# Patient Record
Sex: Female | Born: 1945 | Race: White | Hispanic: No | Smoking: Never smoker
Health system: Southern US, Community
[De-identification: ages and names within clinical notes are randomized; demographics above are authoritative.]

## PROBLEM LIST (undated history)

## (undated) DIAGNOSIS — Z8489 Family history of other specified conditions: Secondary | ICD-10-CM

## (undated) DIAGNOSIS — E039 Hypothyroidism, unspecified: Secondary | ICD-10-CM

## (undated) DIAGNOSIS — E538 Deficiency of other specified B group vitamins: Secondary | ICD-10-CM

## (undated) DIAGNOSIS — F410 Panic disorder [episodic paroxysmal anxiety] without agoraphobia: Secondary | ICD-10-CM

## (undated) DIAGNOSIS — R319 Hematuria, unspecified: Secondary | ICD-10-CM

## (undated) DIAGNOSIS — K219 Gastro-esophageal reflux disease without esophagitis: Secondary | ICD-10-CM

## (undated) DIAGNOSIS — I10 Essential (primary) hypertension: Secondary | ICD-10-CM

## (undated) DIAGNOSIS — K56699 Other intestinal obstruction unspecified as to partial versus complete obstruction: Secondary | ICD-10-CM

## (undated) DIAGNOSIS — E7800 Pure hypercholesterolemia, unspecified: Secondary | ICD-10-CM

## (undated) DIAGNOSIS — F419 Anxiety disorder, unspecified: Secondary | ICD-10-CM

## (undated) DIAGNOSIS — E785 Hyperlipidemia, unspecified: Secondary | ICD-10-CM

## (undated) DIAGNOSIS — K5792 Diverticulitis of intestine, part unspecified, without perforation or abscess without bleeding: Secondary | ICD-10-CM

## (undated) DIAGNOSIS — K760 Fatty (change of) liver, not elsewhere classified: Secondary | ICD-10-CM

## (undated) HISTORY — DX: Essential (primary) hypertension: I10

## (undated) HISTORY — DX: Deficiency of other specified B group vitamins: E53.8

## (undated) HISTORY — PX: BREAST BIOPSY: SHX20

## (undated) HISTORY — DX: Diverticulitis of intestine, part unspecified, without perforation or abscess without bleeding: K57.92

## (undated) HISTORY — DX: Hematuria, unspecified: R31.9

## (undated) HISTORY — DX: Panic disorder (episodic paroxysmal anxiety): F41.0

## (undated) HISTORY — DX: Fatty (change of) liver, not elsewhere classified: K76.0

## (undated) HISTORY — DX: Anxiety disorder, unspecified: F41.9

## (undated) HISTORY — DX: Hyperlipidemia, unspecified: E78.5

## (undated) HISTORY — DX: Other intestinal obstruction unspecified as to partial versus complete obstruction: K56.699

## (undated) HISTORY — DX: Hypothyroidism, unspecified: E03.9

---

## 1987-05-19 HISTORY — PX: SMALL INTESTINE SURGERY: SHX150

## 1987-05-19 HISTORY — PX: ABDOMINAL HYSTERECTOMY: SHX81

## 2004-01-23 ENCOUNTER — Other Ambulatory Visit: Payer: Self-pay

## 2004-05-06 ENCOUNTER — Ambulatory Visit: Payer: Self-pay | Admitting: Unknown Physician Specialty

## 2005-08-03 ENCOUNTER — Ambulatory Visit: Payer: Self-pay | Admitting: Unknown Physician Specialty

## 2006-08-09 ENCOUNTER — Ambulatory Visit: Payer: Self-pay | Admitting: Unknown Physician Specialty

## 2007-08-11 ENCOUNTER — Ambulatory Visit: Payer: Self-pay | Admitting: Unknown Physician Specialty

## 2007-11-22 ENCOUNTER — Ambulatory Visit: Payer: Self-pay | Admitting: Unknown Physician Specialty

## 2008-08-15 ENCOUNTER — Ambulatory Visit: Payer: Self-pay | Admitting: Unknown Physician Specialty

## 2009-08-20 ENCOUNTER — Ambulatory Visit: Payer: Self-pay | Admitting: Unknown Physician Specialty

## 2010-08-27 ENCOUNTER — Ambulatory Visit: Payer: Self-pay | Admitting: Unknown Physician Specialty

## 2010-09-19 ENCOUNTER — Ambulatory Visit: Payer: Self-pay | Admitting: Urology

## 2010-11-21 ENCOUNTER — Ambulatory Visit: Payer: Self-pay | Admitting: Unknown Physician Specialty

## 2011-10-19 ENCOUNTER — Ambulatory Visit: Payer: Self-pay | Admitting: Unknown Physician Specialty

## 2012-01-22 DIAGNOSIS — R319 Hematuria, unspecified: Secondary | ICD-10-CM

## 2012-01-22 HISTORY — DX: Hematuria, unspecified: R31.9

## 2012-02-11 DIAGNOSIS — I10 Essential (primary) hypertension: Secondary | ICD-10-CM

## 2012-02-11 HISTORY — DX: Essential (primary) hypertension: I10

## 2012-04-18 ENCOUNTER — Ambulatory Visit: Payer: Self-pay | Admitting: Internal Medicine

## 2012-04-18 DIAGNOSIS — K760 Fatty (change of) liver, not elsewhere classified: Secondary | ICD-10-CM | POA: Insufficient documentation

## 2012-04-18 HISTORY — DX: Fatty (change of) liver, not elsewhere classified: K76.0

## 2012-07-19 ENCOUNTER — Ambulatory Visit: Payer: Self-pay | Admitting: Unknown Physician Specialty

## 2012-10-14 DIAGNOSIS — E785 Hyperlipidemia, unspecified: Secondary | ICD-10-CM | POA: Insufficient documentation

## 2012-10-14 HISTORY — DX: Hyperlipidemia, unspecified: E78.5

## 2012-10-19 ENCOUNTER — Ambulatory Visit: Payer: Self-pay | Admitting: Internal Medicine

## 2013-11-05 DIAGNOSIS — E039 Hypothyroidism, unspecified: Secondary | ICD-10-CM | POA: Insufficient documentation

## 2013-11-05 HISTORY — DX: Hypothyroidism, unspecified: E03.9

## 2013-11-07 DIAGNOSIS — E538 Deficiency of other specified B group vitamins: Secondary | ICD-10-CM | POA: Insufficient documentation

## 2013-11-07 HISTORY — DX: Deficiency of other specified B group vitamins: E53.8

## 2013-12-07 ENCOUNTER — Ambulatory Visit: Payer: Self-pay | Admitting: Internal Medicine

## 2014-04-11 ENCOUNTER — Emergency Department: Payer: Self-pay | Admitting: Emergency Medicine

## 2014-11-12 ENCOUNTER — Other Ambulatory Visit: Payer: Self-pay | Admitting: Psychiatry

## 2014-11-12 DIAGNOSIS — Z1231 Encounter for screening mammogram for malignant neoplasm of breast: Secondary | ICD-10-CM

## 2014-12-10 ENCOUNTER — Other Ambulatory Visit: Payer: Self-pay | Admitting: Psychiatry

## 2014-12-10 ENCOUNTER — Ambulatory Visit
Admission: RE | Admit: 2014-12-10 | Discharge: 2014-12-10 | Disposition: A | Discharge status: Home / Self Care | Payer: Medicare Other | Source: Ambulatory Visit | Attending: Psychiatry | Admitting: Psychiatry

## 2014-12-10 DIAGNOSIS — Z1231 Encounter for screening mammogram for malignant neoplasm of breast: Secondary | ICD-10-CM

## 2015-03-01 ENCOUNTER — Encounter: Payer: Self-pay | Admitting: *Deleted

## 2015-03-01 ENCOUNTER — Inpatient Hospital Stay
Admission: EM | Admit: 2015-03-01 | Discharge: 2015-03-05 | DRG: 392 | Disposition: A | Discharge status: Home / Self Care | Payer: Medicare Other | Attending: General Surgery | Admitting: General Surgery

## 2015-03-01 ENCOUNTER — Emergency Department: Payer: Medicare Other

## 2015-03-01 DIAGNOSIS — Z79899 Other long term (current) drug therapy: Secondary | ICD-10-CM | POA: Diagnosis not present

## 2015-03-01 DIAGNOSIS — Z90710 Acquired absence of both cervix and uterus: Secondary | ICD-10-CM

## 2015-03-01 DIAGNOSIS — R1030 Lower abdominal pain, unspecified: Secondary | ICD-10-CM

## 2015-03-01 DIAGNOSIS — K760 Fatty (change of) liver, not elsewhere classified: Secondary | ICD-10-CM | POA: Diagnosis present

## 2015-03-01 DIAGNOSIS — E7800 Pure hypercholesterolemia, unspecified: Secondary | ICD-10-CM | POA: Diagnosis present

## 2015-03-01 DIAGNOSIS — K5732 Diverticulitis of large intestine without perforation or abscess without bleeding: Secondary | ICD-10-CM | POA: Diagnosis present

## 2015-03-01 DIAGNOSIS — K5720 Diverticulitis of large intestine with perforation and abscess without bleeding: Secondary | ICD-10-CM | POA: Diagnosis present

## 2015-03-01 DIAGNOSIS — I10 Essential (primary) hypertension: Secondary | ICD-10-CM | POA: Diagnosis present

## 2015-03-01 DIAGNOSIS — K5792 Diverticulitis of intestine, part unspecified, without perforation or abscess without bleeding: Secondary | ICD-10-CM | POA: Diagnosis present

## 2015-03-01 HISTORY — DX: Essential (primary) hypertension: I10

## 2015-03-01 HISTORY — DX: Pure hypercholesterolemia, unspecified: E78.00

## 2015-03-01 LAB — COMPREHENSIVE METABOLIC PANEL
ALBUMIN: 3.8 g/dL (ref 3.5–5.0)
ALK PHOS: 65 U/L (ref 38–126)
ALT: 18 U/L (ref 14–54)
AST: 23 U/L (ref 15–41)
Anion gap: 9 (ref 5–15)
BILIRUBIN TOTAL: 3.5 mg/dL — AB (ref 0.3–1.2)
BUN: 13 mg/dL (ref 6–20)
CALCIUM: 8.9 mg/dL (ref 8.9–10.3)
CO2: 27 mmol/L (ref 22–32)
Chloride: 102 mmol/L (ref 101–111)
Creatinine, Ser: 0.88 mg/dL (ref 0.44–1.00)
GFR calc Af Amer: >60 mL/min (ref >60)
GFR calc non Af Amer: >60 mL/min (ref >60)
GLUCOSE: 144 mg/dL — AB (ref 65–99)
POTASSIUM: 3.2 mmol/L — AB (ref 3.5–5.1)
SODIUM: 138 mmol/L (ref 135–145)
TOTAL PROTEIN: 7.4 g/dL (ref 6.5–8.1)

## 2015-03-01 LAB — CBC
HEMATOCRIT: 40 % (ref 35.0–47.0)
Hemoglobin: 13.5 g/dL (ref 12.0–16.0)
MCH: 31.2 pg (ref 26.0–34.0)
MCHC: 33.8 g/dL (ref 32.0–36.0)
MCV: 92.4 fL (ref 80.0–100.0)
Platelets: 162 10*3/uL (ref 150–440)
RBC: 4.33 MIL/uL (ref 3.80–5.20)
RDW: 13.3 % (ref 11.5–14.5)
WBC: 15.5 10*3/uL — ABNORMAL HIGH (ref 3.6–11.0)

## 2015-03-01 LAB — URINALYSIS COMPLETE WITH MICROSCOPIC (ARMC ONLY)
BILIRUBIN URINE: NEGATIVE
GLUCOSE, UA: NEGATIVE mg/dL
Ketones, ur: NEGATIVE mg/dL
Nitrite: NEGATIVE
Protein, ur: 30 mg/dL — AB
SPECIFIC GRAVITY, URINE: 1.025 (ref 1.005–1.030)
pH: 5 (ref 5.0–8.0)

## 2015-03-01 LAB — LIPASE, BLOOD: Lipase: 23 U/L (ref 22–51)

## 2015-03-01 MED ORDER — KCL IN DEXTROSE-NACL 20-5-0.45 MEQ/L-%-% IV SOLN
INTRAVENOUS | Status: DC
  Administered 2015-03-02 – 2015-03-05 (×7): via INTRAVENOUS
  Filled 2015-03-01 (×11): qty 1000

## 2015-03-01 MED ORDER — PIPERACILLIN-TAZOBACTAM 3.375 G IVPB
3.3750 g | Freq: Three times a day (TID) | INTRAVENOUS | Status: DC
  Administered 2015-03-02 – 2015-03-05 (×11): 3.375 g via INTRAVENOUS
  Filled 2015-03-01 (×13): qty 50

## 2015-03-01 MED ORDER — CIPROFLOXACIN IN D5W 400 MG/200ML IV SOLN
400.0000 mg | Freq: Once | INTRAVENOUS | Status: AC
  Administered 2015-03-01: 400 mg via INTRAVENOUS
  Filled 2015-03-01: qty 200

## 2015-03-01 MED ORDER — ENOXAPARIN SODIUM 40 MG/0.4ML ~~LOC~~ SOLN
40.0000 mg | SUBCUTANEOUS | Status: DC
  Administered 2015-03-02 – 2015-03-05 (×4): 40 mg via SUBCUTANEOUS
  Filled 2015-03-01 (×4): qty 0.4

## 2015-03-01 MED ORDER — DIPHENHYDRAMINE HCL 50 MG/ML IJ SOLN: 25.0000 mg | Freq: Four times a day (QID) | INTRAMUSCULAR | Status: DC | PRN

## 2015-03-01 MED ORDER — PB-HYOSCY-ATROPINE-SCOPOLAMINE 16.2 MG/5ML PO ELIX
10.0000 mL | ORAL_SOLUTION | Freq: Once | ORAL | Status: AC
  Administered 2015-03-01: 32.4 mg via ORAL
  Filled 2015-03-01: qty 10

## 2015-03-01 MED ORDER — ONDANSETRON HCL 4 MG/2ML IJ SOLN
4.0000 mg | Freq: Four times a day (QID) | INTRAMUSCULAR | Status: DC | PRN
  Administered 2015-03-02 – 2015-03-03 (×2): 4 mg via INTRAVENOUS
  Filled 2015-03-01 (×2): qty 2

## 2015-03-01 MED ORDER — METOCLOPRAMIDE HCL 5 MG/ML IJ SOLN
10.0000 mg | Freq: Once | INTRAMUSCULAR | Status: AC
  Administered 2015-03-01: 10 mg via INTRAVENOUS
  Filled 2015-03-01: qty 2

## 2015-03-01 MED ORDER — IOHEXOL 300 MG/ML  SOLN
100.0000 mL | Freq: Once | INTRAMUSCULAR | Status: AC | PRN
  Administered 2015-03-01: 100 mL via INTRAVENOUS

## 2015-03-01 MED ORDER — ACETAMINOPHEN 10 MG/ML IV SOLN
1000.0000 mg | Freq: Three times a day (TID) | INTRAVENOUS | Status: DC | PRN
  Administered 2015-03-02: 1000 mg via INTRAVENOUS
  Filled 2015-03-01 (×2): qty 100

## 2015-03-01 MED ORDER — METRONIDAZOLE IN NACL 5-0.79 MG/ML-% IV SOLN
500.0000 mg | Freq: Once | INTRAVENOUS | Status: AC
  Administered 2015-03-01: 500 mg via INTRAVENOUS
  Filled 2015-03-01: qty 100

## 2015-03-01 MED ORDER — IOHEXOL 240 MG/ML SOLN: 25.0000 mL | Freq: Once | INTRAMUSCULAR | Status: DC | PRN

## 2015-03-01 MED ORDER — HYDROMORPHONE HCL 1 MG/ML IJ SOLN
0.5000 mg | INTRAMUSCULAR | Status: AC | PRN
  Administered 2015-03-01 (×2): 0.5 mg via INTRAVENOUS
  Filled 2015-03-01: qty 1

## 2015-03-01 MED ORDER — SODIUM CHLORIDE 0.9 % IV BOLUS (SEPSIS)
500.0000 mL | Freq: Once | INTRAVENOUS | Status: AC
  Administered 2015-03-01: 500 mL via INTRAVENOUS

## 2015-03-01 MED ORDER — ONDANSETRON 4 MG PO TBDP: 4.0000 mg | ORAL_TABLET | Freq: Four times a day (QID) | ORAL | Status: DC | PRN

## 2015-03-01 MED ORDER — HYDRALAZINE HCL 20 MG/ML IJ SOLN: 10.0000 mg | INTRAMUSCULAR | Status: DC | PRN

## 2015-03-01 MED ORDER — MORPHINE SULFATE (PF) 4 MG/ML IV SOLN
4.0000 mg | INTRAVENOUS | Status: DC | PRN
  Administered 2015-03-02 – 2015-03-04 (×3): 4 mg via INTRAVENOUS
  Filled 2015-03-01 (×3): qty 1

## 2015-03-01 MED ORDER — HYDROMORPHONE HCL 1 MG/ML IJ SOLN
INTRAMUSCULAR | Status: AC
  Filled 2015-03-01: qty 1

## 2015-03-01 MED ORDER — PANTOPRAZOLE SODIUM 40 MG IV SOLR
40.0000 mg | Freq: Every day | INTRAVENOUS | Status: DC
Start: 2015-03-01 — End: 2015-03-04
  Administered 2015-03-02 – 2015-03-03 (×3): 40 mg via INTRAVENOUS
  Filled 2015-03-01 (×3): qty 40

## 2015-03-01 MED ORDER — DIPHENHYDRAMINE HCL 25 MG PO CAPS: 25.0000 mg | ORAL_CAPSULE | Freq: Four times a day (QID) | ORAL | Status: DC | PRN

## 2015-03-01 NOTE — ED Notes (Signed)
Pt reports lower abdominal pain mid line since last night, one episode of vomiting today.

## 2015-03-01 NOTE — ED Provider Notes (Signed)
[facility] Emergency Department Provider Note  ____________________________________________  Time seen: 1823  I have reviewed the triage vital signs and the nursing notes.   HISTORY  Chief Complaint Abdominal Pain     HPI Tracy Haas is a 69 y.o. female who presents emergency Department with a complaint of lower abdominal pain. It began last night. She says the pain last night was terrible, 8 out of 10. It was constant. It has continued today, although it has waxed and waned more. She in general has not had any nausea with the symptoms last night, however today she did become nauseous and had one episode of emesis. She reports that she has loose stool frequently, at baseline. She has had a bowel movement today which was loose, but it was not black, dark, or bloody, or out of character for her usual bowel pattern.  The patient does have a history of a bowel resection. She reports she had an obstruction which required 5 inches of her bowels to be removed. She is unclear if this is small intestines are large, but I suspect small, given the location of the incision was midline lower abdomen. She is also status post hysterectomy.    Past Medical History  Diagnosis Date  . Hypertension   . High cholesterol     There are no active problems to display for this patient.   Past Surgical History  Procedure Laterality Date  . Breast biopsy Right     neg  . Abdominal hysterectomy      No current outpatient prescriptions on file.  Allergies Review of patient's allergies indicates no known allergies.  No family history on file.  Social History Social History  Substance Use Topics  . Smoking status: Never Smoker   . Smokeless tobacco: None  . Alcohol Use: No    Review of Systems  Constitutional: Negative for fever. ENT: Negative for sore throat. Cardiovascular: Negative for chest pain. Respiratory: Negative for cough. Gastrointestinal: Abdominal  pain. See history of present illness Genitourinary: Negative for dysuria. Musculoskeletal: No myalgias or injuries. Skin: Negative for rash. Neurological: Negative for paresthesia or weakness   10-point ROS otherwise negative.  ____________________________________________   PHYSICAL EXAM:  VITAL SIGNS: ED Triage Vitals  Enc Vitals Group     BP 03/01/15 1755 141/65 mmHg     Pulse Rate 03/01/15 1755 83     Resp 03/01/15 1755 16     Temp 03/01/15 1755 98.3 F (36.8 C)     Temp Source 03/01/15 1755 Oral     SpO2 03/01/15 1755 99 %     Weight 03/01/15 1755 140 lb (63.504 kg)     Height 03/01/15 1755  (1.549 m)     Head Cir --      Peak Flow --      Pain Score 03/01/15 1753 4     Pain Loc --      Pain Edu? --      Excl. in GC? --     Constitutional:  Alert and oriented. Patient is a little bit anxious, looks slightly uncomfortable, but in no acute distress.Marland Kitchen ENT   Head: Normocephalic and atraumatic.   Nose: No congestion/rhinnorhea.    Cardiovascular: Normal rate, regular rhythm, no murmur noted Respiratory:  Normal respiratory effort, no tachypnea.    Breath sounds are clear and equal bilaterally.  Gastrointestinal: No distention. Normal bowel sounds. She has mild tenderness across lower abdomen, particularly in the midline area, but no peritoneal  signs.  Back: No muscle spasm, no tenderness, no CVA tenderness. Musculoskeletal: No deformity noted. Nontender with normal range of motion in all extremities.  No noted edema. Neurologic:  Normal speech and language. No gross focal neurologic deficits are appreciated.  Skin:  Skin is warm, dry. No rash noted. Psychiatric: Mood and affect are normal. Speech and behavior are normal.  ____________________________________________    LABS (pertinent positives/negatives)  Labs Reviewed  COMPREHENSIVE METABOLIC PANEL - Abnormal; Notable for the following:    Potassium 3.2 (*)    Glucose, Bld 144 (*)    Total Bilirubin  3.5 (*)    All other components within normal limits  CBC - Abnormal; Notable for the following:    WBC 15.5 (*)    All other components within normal limits  URINALYSIS COMPLETEWITH MICROSCOPIC ([facility] ONLY) - Abnormal; Notable for the following:    Color, Urine YELLOW (*)    APPearance HAZY (*)    Hgb urine dipstick 2+ (*)    Protein, ur 30 (*)    Leukocytes, UA 2+ (*)    Bacteria, UA RARE (*)    Squamous Epithelial / LPF 0-5 (*)    All other components within normal limits  LIPASE, BLOOD     ____________________________________________   ____________________________________________    RADIOLOGY  CT abdomen and pelvis  IMPRESSION: 1. Diverticulosis of the sigmoid colon with bowel wall thickening and pericolonic inflammatory changes most consistent with diverticulitis. Small area of extraluminal air adjacent to the anti mesenteric aspect of the sigmoid colon most consistent with a contained perforation with a adjacent small fluid collection likely reflecting a developing abscess. 2. Hepatic steatosis.  ____________________________________________   INITIAL IMPRESSION / ASSESSMENT AND PLAN / ED COURSE  Pertinent labs & imaging results that were available during my care of the patient were reviewed by me and considered in my medical decision making (see chart for details).  Pleasant 69 year old female with crampy intermittent lower abdominal pain radiating to her lower back. She does have a history of a resection many years ago. Given her history and level of discomfort, we will perform a CT scan of her abdomen and pelvis.  ----------------------------------------- 9:05 PM on 03/01/2015 -----------------------------------------  CT scan shows diverticulosis with bowel wall thickening consistent with diverticulitis to the sigmoid colon. She has what appears to be contained perforations. She has a white blood cell count of 15.5 thousand. Given these findings, we will  initiate antibiotics and admit her to the hospital for ongoing treatment and reevaluation.  This was discussed with Dr. Tonita CongWoodham who will see the patient emergency department.  ____________________________________________   FINAL CLINICAL IMPRESSION(S) / ED DIAGNOSES  Final diagnoses:  Diverticulitis of large intestine with perforation without bleeding  Lower abdominal pain      Darien Ramusavid W Shankar Silber, MD 03/01/15 2109

## 2015-03-01 NOTE — H&P (Signed)
Patient ID: Tracy Haas, female   DOB: 11-Jul-1945, 69 y.o.   MRN: [id number] CC: ABDOMINAL PAIN HPI Tracy Haas is a 69 y.o. female presents emergency department with a 2 day history of abdominal pain. Patient states that the pain kept her up last night and then gradually worsened throughout the day. She was attempting E dinner earlier this evening which she then had emesis during upper entire dinner. She states the pain is primarily in the lower midline and off to the left and she's never had anything like this before. She states she is having normal bowel function for her. She denies any current fevers, chills, chest pain, shortness of breath. The pain is described as sharp and throbbing. Has remained in her lower midline and left lower quadrant without movement.  HPI  Past Medical History  Diagnosis Date  . Hypertension   . High cholesterol     Past Surgical History  Procedure Laterality Date  . Breast biopsy Right     neg  . Abdominal hysterectomy     patient reports a hysterectomy and multiple surgeries for bowel obstruction all performed in the 1980s. All surgeries were performed through a Pfannenstiel incision.  No family history on file.  Social History Social History  Substance Use Topics  . Smoking status: Never Smoker   . Smokeless tobacco: None  . Alcohol Use: No    No Known Allergies  Current Facility-Administered Medications  Medication Dose Route Frequency Provider Last Rate Last Dose  . ciprofloxacin (CIPRO) IVPB 400 mg  400 mg Intravenous Once Darien Ramus, MD   Stopped at 03/01/15 2127  . iohexol (OMNIPAQUE) 240 MG/ML injection 25 mL  25 mL Oral Once PRN Medication Radiologist, MD      . metroNIDAZOLE (FLAGYL) IVPB 500 mg  500 mg Intravenous Once Darien Ramus, MD 100 mL/hr at 03/01/15 2118 500 mg at 03/01/15 2118   Current Outpatient Prescriptions  Medication Sig Dispense Refill  . bisoprolol-hydrochlorothiazide (ZIAC) 10-6.25 MG tablet Take 1  tablet by mouth daily.    . Cyanocobalamin (RA VITAMIN B-12 TR) 1000 MCG TBCR Take 1 tablet by mouth daily.    Marland Kitchen levothyroxine (SYNTHROID, LEVOTHROID) 75 MCG tablet Take 1 tablet by mouth daily.    Marland Kitchen omeprazole (PRILOSEC) 20 MG capsule Take 1 capsule by mouth daily.    . ramipril (ALTACE) 2.5 MG capsule Take 2.5 mg by mouth daily.    . simvastatin (ZOCOR) 20 MG tablet Take 20 mg by mouth daily.       Review of Systems A multipoint review of systems was completed. All pertinent positives and negatives within the history of present illness the remainder were negative.  Physical Exam Blood pressure 108/68, pulse 81, temperature 98.1 F (36.7 C), temperature source Oral, resp. rate 16, height  (1.549 m), weight 63.504 kg (140 lb), SpO2 100 %. CONSTITUTIONAL: Resting in bed in no acute distress.Marland Kitchen EYES: Pupils are equal, round, and reactive to light, Sclera are non-icteric. EARS, NOSE, MOUTH AND THROAT: The oropharynx is clear. The oral mucosa is pink and moist. Hearing is intact to voice. LYMPH NODES:  Lymph nodes in the neck are normal. RESPIRATORY:  Lungs are clear. There is normal respiratory effort, with equal breath sounds bilaterally, and without pathologic use of accessory muscles. CARDIOVASCULAR: Heart is regular without murmurs, gallops, or rubs. GI: The abdomen is soft, minimally tender to deep palpation in the left lower quadrant, and nondistended. There are no palpable masses. There is  no hepatosplenomegaly. There are normal bowel sounds in all quadrants. Well-healed Pfannenstiel incision. GU: Rectal deferred.   MUSCULOSKELETAL: Normal muscle strength and tone. No cyanosis or edema.   SKIN: Turgor is good and there are no pathologic skin lesions or ulcers. NEUROLOGIC: Motor and sensation is grossly normal. Cranial nerves are grossly intact. PSYCH:  Oriented to person, place and time. Affect is normal.  Data Reviewed CT scan labs reviewed. Labs with a leukocytosis of 15.1.  Also with a hypokalemia. CT scan with obvious area of inflammation in the sigmoid colon with a small amount of free fluid adjacent to this and perhaps a single dot of extraluminal air. No obvious abscess. I have personally reviewed the patient's imaging, laboratory findings and medical records.    Assessment    69 year old female with diverticulitis    Plan    Discussed in detail the treatment of diverticulitis with the patient and her son. We'll plan to admit to MedSurg unit for IV hydration and antibiotics. We'll keep nothing by mouth until pain free. After that transitioned to oral antibiotic for two-week course. Discussed with the patient on a rare occasion antibiotics are ineffective and urgent surgery is needed. We'll follow closely to ensure resolution of symptoms.     Time spent with the patient was 30 minutes, with more than 50% of the time spent in face-to-face education, counseling and care coordination.     Ricarda FrameCharles Derrion Tritz 03/01/2015, 9:53 PM

## 2015-03-02 LAB — CBC
HCT: 36.4 % (ref 35.0–47.0)
Hemoglobin: 12.5 g/dL (ref 12.0–16.0)
MCH: 31.7 pg (ref 26.0–34.0)
MCHC: 34.4 g/dL (ref 32.0–36.0)
MCV: 92.1 fL (ref 80.0–100.0)
PLATELETS: 142 10*3/uL — AB (ref 150–440)
RBC: 3.96 MIL/uL (ref 3.80–5.20)
RDW: 13.3 % (ref 11.5–14.5)
WBC: 19.3 10*3/uL — ABNORMAL HIGH (ref 3.6–11.0)

## 2015-03-02 LAB — MAGNESIUM: Magnesium: 1.5 mg/dL — ABNORMAL LOW (ref 1.7–2.4)

## 2015-03-02 LAB — BASIC METABOLIC PANEL
ANION GAP: 9 (ref 5–15)
BUN: 10 mg/dL (ref 6–20)
CALCIUM: 8 mg/dL — AB (ref 8.9–10.3)
CO2: 24 mmol/L (ref 22–32)
CREATININE: 0.9 mg/dL (ref 0.44–1.00)
Chloride: 105 mmol/L (ref 101–111)
GFR calc Af Amer: >60 mL/min (ref >60)
GLUCOSE: 165 mg/dL — AB (ref 65–99)
Potassium: 3 mmol/L — ABNORMAL LOW (ref 3.5–5.1)
Sodium: 138 mmol/L (ref 135–145)

## 2015-03-02 LAB — PHOSPHORUS: PHOSPHORUS: 3.9 mg/dL (ref 2.5–4.6)

## 2015-03-02 MED ORDER — ACETAMINOPHEN 325 MG PO TABS
650.0000 mg | ORAL_TABLET | ORAL | Status: DC | PRN
  Administered 2015-03-02 (×2): 650 mg via ORAL
  Filled 2015-03-02 (×2): qty 2

## 2015-03-02 MED ORDER — POTASSIUM CHLORIDE 10 MEQ/100ML IV SOLN
10.0000 meq | INTRAVENOUS | Status: AC
  Administered 2015-03-02 (×4): 10 meq via INTRAVENOUS
  Filled 2015-03-02 (×4): qty 100

## 2015-03-02 NOTE — Progress Notes (Signed)
Per Dr. Egbert GaribaldiBird okay to discontinue IV tylenol. Place order for npo except ice chips and sips with meds. Also place order for tylenol 650 mg q4hrs prn as needed for temp greater than 101. Okay to give dose of tylenol.

## 2015-03-02 NOTE — Progress Notes (Signed)
Patient ID: Tracy Haas, female   DOB: [birth year], 69 y.o.   MRN: [id number]   Surgery  She is feeling better. More cramping abdominal pain and rule out right pain.  Filed Vitals:   03/02/15 0335 03/02/15 0408 03/02/15 0934 03/02/15 1252  BP: 107/54 98/47 96/50  108/56  Pulse: 75 78 86 82  Temp: 98.2 F (36.8 C) 98.9 F (37.2 C) 100 F (37.8 C) 100.4 F (38 C)  TempSrc: Oral Oral Oral Oral  Resp: 16 16 17 17   Height:      Weight:      SpO2: 98% 95% 92% 94%    Physical examination the patient's abdomen is somewhat distended. There is minimal tenderness to deep palpation left lower quadrant.  Impression perforated diverticulitis. Improving.  Plan continue antibiotics and nothing by mouth status meds with sips. I anticipate she'll not need surgery.

## 2015-03-03 DIAGNOSIS — K5720 Diverticulitis of large intestine with perforation and abscess without bleeding: Principal | ICD-10-CM

## 2015-03-03 LAB — BASIC METABOLIC PANEL
Anion gap: 4 — ABNORMAL LOW (ref 5–15)
BUN: 6 mg/dL (ref 6–20)
CALCIUM: 7.9 mg/dL — AB (ref 8.9–10.3)
CO2: 23 mmol/L (ref 22–32)
CREATININE: 0.68 mg/dL (ref 0.44–1.00)
Chloride: 108 mmol/L (ref 101–111)
Glucose, Bld: 162 mg/dL — ABNORMAL HIGH (ref 65–99)
Potassium: 3.4 mmol/L — ABNORMAL LOW (ref 3.5–5.1)
Sodium: 135 mmol/L (ref 135–145)

## 2015-03-03 LAB — PHOSPHORUS: PHOSPHORUS: 1.7 mg/dL — AB (ref 2.5–4.6)

## 2015-03-03 LAB — MAGNESIUM: Magnesium: 1.5 mg/dL — ABNORMAL LOW (ref 1.7–2.4)

## 2015-03-03 MED ORDER — POTASSIUM CHLORIDE 10 MEQ/100ML IV SOLN
10.0000 meq | INTRAVENOUS | Status: AC
  Administered 2015-03-03 (×2): 10 meq via INTRAVENOUS
  Filled 2015-03-03 (×2): qty 100

## 2015-03-03 NOTE — Progress Notes (Signed)
Patient ID: Tracy Haas, female   DOB: 1945/07/27, 69 y.o.   MRN: [id number]   [facility]   PATIENT NAME: Tracy Haas    MR#:  [id number]  DATE OF BIRTH:  1945/07/27  SUBJECTIVE:   She is feeling better. She is still remains somewhat distended. She is having flatus. She is hungry. Last night she had some crampy abdominal pain requiring morphine.  REVIEW OF SYSTEMS:   Review of Systems  Constitutional: Negative for fever and chills.  HENT: Negative.   Gastrointestinal: Positive for abdominal pain. Negative for nausea and vomiting.  Genitourinary: Negative for dysuria.  Neurological: Negative for dizziness.  Psychiatric/Behavioral: Negative for depression.    DRUG ALLERGIES:  No Known Allergies  VITALS:  Blood pressure 147/71, pulse 85, temperature 99.8 F (37.7 C), temperature source Oral, resp. rate 16, height 5\' 1"  (1.549 m), weight 147 lb 11.2 oz (66.996 kg), SpO2 92 %.  I/O last 3 completed shifts: In: 3160.2 [I.V.:2538.2; IV Piggyback:622] Out: 2200 [Urine:2200] Total I/O In: 583 [I.V.:531; IV Piggyback:52] Out: 50 [Urine:50]   PHYSICAL EXAMINATION:  Physical Exam  Constitutional: She is oriented to person, place, and time and well-developed, well-nourished, and in no distress. No distress.  HENT:  Head: Normocephalic and atraumatic.  Eyes: Pupils are equal, round, and reactive to light.  Cardiovascular: Normal rate.   Pulmonary/Chest: Effort normal and breath sounds normal.  Abdominal: Soft. She exhibits distension. She exhibits no mass. There is tenderness. There is no rebound and no guarding.  Neurological: She is oriented to person, place, and time.  Skin: She is not diaphoretic.      ASSESSMENT AND PLAN:   69 year old white female with perforated sigmoid diverticulitis. She is clinically improved compared to yesterday. She is not currently pain-free. We'll continue to be his antibiotics. I will allow a full liquid diet. Reasses in the  next 24 hours.

## 2015-03-04 LAB — CBC
HEMATOCRIT: 32.2 % — AB (ref 35.0–47.0)
Hemoglobin: 10.9 g/dL — ABNORMAL LOW (ref 12.0–16.0)
MCH: 31.5 pg (ref 26.0–34.0)
MCHC: 34 g/dL (ref 32.0–36.0)
MCV: 92.8 fL (ref 80.0–100.0)
PLATELETS: 139 10*3/uL — AB (ref 150–440)
RBC: 3.47 MIL/uL — AB (ref 3.80–5.20)
RDW: 13.1 % (ref 11.5–14.5)
WBC: 8.4 10*3/uL (ref 3.6–11.0)

## 2015-03-04 MED ORDER — PANTOPRAZOLE SODIUM 40 MG PO TBEC
40.0000 mg | DELAYED_RELEASE_TABLET | Freq: Every day | ORAL | Status: DC
  Administered 2015-03-05: 40 mg via ORAL
  Filled 2015-03-04: qty 1

## 2015-03-04 NOTE — Progress Notes (Signed)
[age]yr old female with acute diverticulitis.  Patient states feeling much better today.  She states still some pressure when she moves and some gas pains but otherwise improved.  Patient states she is passing flatus without any issues.    Filed Vitals:   03/04/15 1622  BP: 140/78  Pulse: 74  Temp: 98.2 F (36.8 C)  Resp: 17   PE:  GEn: NAD Res: CTAB/L Cardio: RRR, no murmur Abd: soft, non-tender, mild distension  Ext: 2+ pulses, no edema  CBC Latest Ref Rng 03/04/2015 03/02/2015 03/01/2015  WBC 3.6 - 11.0 K/uL 8.4 19.3(H) 15.5(H)  Hemoglobin 12.0 - 16.0 g/dL 10.9(L) 12.5 13.5  Hematocrit 35.0 - 47.0 % 32.2(L) 36.4 40.0  Platelets 150 - 440 K/uL 139(L) 142(L) 162    CMP Latest Ref Rng 03/03/2015 03/02/2015 03/01/2015  Glucose 65 - 99 mg/dL 528(U162(H) 132(G165(H) 401(U144(H)  BUN 6 - 20 mg/dL 6 10 13   Creatinine 0.44 - 1.00 mg/dL 2.720.68 5.360.90 6.440.88  Sodium 135 - 145 mmol/L 135 138 138  Potassium 3.5 - 5.1 mmol/L 3.4(L) 3.0(L) 3.2(L)  Chloride 101 - 111 mmol/L 108 105 102  CO2 22 - 32 mmol/L 23 24 27   Calcium 8.9 - 10.3 mg/dL 7.9(L) 8.0(L) 8.9  Total Protein 6.5 - 8.1 g/dL - - 7.4  Total Bilirubin 0.3 - 1.2 mg/dL - - 3.5(H)  Alkaline Phos 38 - 126 U/L - - 65  AST 15 - 41 U/L - - 23  ALT 14 - 54 U/L - - 18    A/P:  Continue Zosyn for diverticulitis--improving Advance diet to regular Change iv to Po protonix Likely d/c tomorrow if tolerates diet well

## 2015-03-04 NOTE — Care Management Important Message (Signed)
Important Message  Patient Details  Name: Tracy Haas MRN: [id number] Date of Birth: May 04, 1946   Medicare Important Message Given:  Yes-second notification given    Adonis HugueninBerkhead, Daishawn Lauf L, RN 03/04/2015, 11:14 AM

## 2015-03-05 MED ORDER — FLUCONAZOLE 100 MG PO TABS
100.0000 mg | ORAL_TABLET | Freq: Once | ORAL | Status: AC
  Administered 2015-03-05: 100 mg via ORAL
  Filled 2015-03-05: qty 1

## 2015-03-05 MED ORDER — RAMIPRIL 2.5 MG PO CAPS
2.5000 mg | ORAL_CAPSULE | Freq: Every day | ORAL | Status: DC
  Administered 2015-03-05: 2.5 mg via ORAL
  Filled 2015-03-05 (×2): qty 1

## 2015-03-05 MED ORDER — AMOXICILLIN-POT CLAVULANATE 875-125 MG PO TABS: 1.0000 | ORAL_TABLET | Freq: Two times a day (BID) | ORAL | Status: DC

## 2015-03-05 MED ORDER — BISOPROLOL-HYDROCHLOROTHIAZIDE 10-6.25 MG PO TABS
1.0000 | ORAL_TABLET | Freq: Every day | ORAL | Status: DC
  Administered 2015-03-05: 1 via ORAL
  Filled 2015-03-05: qty 1

## 2015-03-05 MED ORDER — AMOXICILLIN-POT CLAVULANATE 875-125 MG PO TABS: 1.0000 | ORAL_TABLET | Freq: Two times a day (BID) | ORAL | Status: AC

## 2015-03-05 NOTE — Discharge Summary (Signed)
Physician Discharge Summary  Patient ID: Tracy Haas K Moshier MRN: [id number] DOB/AGE: 1946-01-20 69 y.o.  Admit date: 03/01/2015 Discharge date: 03/05/2015  Admission Diagnoses: Acute diverticulitis  Discharge Diagnoses:  Active Problems:   Diverticulitis   Diverticulitis of large intestine with perforation without bleeding   Discharged Condition: good  Hospital Course:  69 yr old with diverticulitis.  Patient has slowly improved.  She is tolerating a regular diet, pain improved and having BMs.  She is having some diarrhea and feels as if she is getting a  Yeast infection.  I gave her a one time dose of Diflucan and instructed to eat yogurt as well.  She will have a 10 day course of Augmentin at home.  She had a colonoscopy about 3 yrs prior with some polyps.  I discussed with her that she would likely need another one in a couple of months as well.   Consults: None  Significant Diagnostic Studies: labs: WBC and radiology: CT scan: Diverticultiis  Treatments: IV hydration and antibiotics: Zosyn  Discharge Exam: Blood pressure 173/100, pulse 73, temperature 98.6 F (37 C), temperature source Oral, resp. rate 17, height 5\' 1"  (1.549 m), weight 147 lb 11.2 oz (66.996 kg), SpO2 97 %. General appearance: alert, cooperative and no distress Resp: clear to auscultation bilaterally Cardio: regular rate and rhythm GI: soft, non-tender; bowel sounds normal; no masses,  no organomegaly Pulses: 2+ and symmetric  Disposition: Final discharge disposition not confirmed      Discharge Instructions    Activity as tolerated - No restrictions    Complete by:  As directed      Call MD for:  persistant nausea and vomiting    Complete by:  As directed      Call MD for:  severe uncontrolled pain    Complete by:  As directed      Call MD for:  temperature >100.4    Complete by:  As directed      Diet general    Complete by:  As directed      Discharge instructions    Complete by:  As directed   Please complete all antibiotic, may have some diarrhea, take yogurt to help with yeast infection.  Call if uncontrolled pain or fever.     Increase activity slowly    Complete by:  As directed      May walk up steps    Complete by:  As directed      No wound care    Complete by:  As directed             Medication List    TAKE these medications        amoxicillin-clavulanate 875-125 MG tablet  Commonly known as:  AUGMENTIN  Take 1 tablet by mouth every 12 (twelve) hours.     levothyroxine 75 MCG tablet  Commonly known as:  SYNTHROID, LEVOTHROID  Take 1 tablet by mouth daily.     omeprazole 20 MG capsule  Commonly known as:  PRILOSEC  Take 1 capsule by mouth daily.     RA VITAMIN B-12 TR 1000 MCG Tbcr  Generic drug:  Cyanocobalamin  Take 1 tablet by mouth daily.     ramipril 2.5 MG capsule  Commonly known as:  ALTACE  Take 2.5 mg by mouth daily.     simvastatin 20 MG tablet  Commonly known as:  ZOCOR  Take 20 mg by mouth daily.     ZIAC 10-6.25 MG tablet  Generic  drug:  bisoprolol-hydrochlorothiazide  Take 1 tablet by mouth daily.       Follow-up Information    Follow up with Ricarda Frame, MD In 2 weeks.   Specialty:  General Surgery   Contact information:   [address] [place] [address] [phone]       Signed: Gladis Riffle 03/05/2015, 2:45 PM

## 2015-03-05 NOTE — Progress Notes (Signed)
03/05/2015 17:00  Tracy Haas to be D/C'd Home per MD order.  Discussed prescriptions and follow up appointments with the patient. Prescriptions given to patient, medication list explained in detail. Pt verbalized understanding.    Medication List    TAKE these medications        amoxicillin-clavulanate 875-125 MG tablet  Commonly known as:  AUGMENTIN  Take 1 tablet by mouth every 12 (twelve) hours.     levothyroxine 75 MCG tablet  Commonly known as:  SYNTHROID, LEVOTHROID  Take 1 tablet by mouth daily.     omeprazole 20 MG capsule  Commonly known as:  PRILOSEC  Take 1 capsule by mouth daily.     RA VITAMIN B-12 TR 1000 MCG Tbcr  Generic drug:  Cyanocobalamin  Take 1 tablet by mouth daily.     ramipril 2.5 MG capsule  Commonly known as:  ALTACE  Take 2.5 mg by mouth daily.     simvastatin 20 MG tablet  Commonly known as:  ZOCOR  Take 20 mg by mouth daily.     ZIAC 10-6.25 MG tablet  Generic drug:  bisoprolol-hydrochlorothiazide  Take 1 tablet by mouth daily.        Filed Vitals:   03/05/15 1255  BP: 173/100  Pulse: 73  Temp:   Resp:     Skin clean, dry and intact without evidence of skin break down, no evidence of skin tears noted. IV catheter discontinued intact. Site without signs and symptoms of complications. Dressing and pressure applied. Pt denies pain at this time. No complaints noted.  An After Visit Summary was printed and given to the patient. Patient escorted via WC, and D/C home via private auto.  Bradly Chrisougherty, Navayah Sok E

## 2015-03-18 ENCOUNTER — Other Ambulatory Visit: Payer: Self-pay | Admitting: *Deleted

## 2015-03-20 ENCOUNTER — Encounter: Payer: Self-pay | Admitting: General Surgery

## 2015-03-20 ENCOUNTER — Telehealth: Payer: Self-pay | Admitting: Gastroenterology

## 2015-03-20 ENCOUNTER — Ambulatory Visit (INDEPENDENT_AMBULATORY_CARE_PROVIDER_SITE_OTHER): Payer: Medicare Other | Admitting: General Surgery

## 2015-03-20 ENCOUNTER — Other Ambulatory Visit: Payer: Self-pay

## 2015-03-20 VITALS — BP 161/89 | HR 66 | Temp 98.1°F | Ht 60.0 in | Wt 141.6 lb

## 2015-03-20 DIAGNOSIS — K5720 Diverticulitis of large intestine with perforation and abscess without bleeding: Secondary | ICD-10-CM | POA: Diagnosis not present

## 2015-03-20 NOTE — Patient Instructions (Signed)
You will be receiving a call from our office after we review your past colonoscopy report to give instructions on how to proceed with your treatment. Please call our office if you have any questions.

## 2015-03-20 NOTE — Progress Notes (Signed)
Outpatient Surgical Follow Up  03/20/2015  Tracy Haas is an 69 y.o. female.   Chief Complaint  Patient presents with  . Follow-up    diverticulitis    HPI: 69 year old female returns to clinic after discharge from treatment for diverticulitis. She states she finished her antibiotic on Monday and has been pain free for at least a week. She states she was having a runny bowel movements of these a return to normal since completing antibiotic. She denies any fevers, chills, nausea, vomiting, abdominal pain, chest pain, shortness of breath. She states she's gradually returned to her normal state of being and has no current complaints.  Past Medical History  Diagnosis Date  . Hypertension   . High cholesterol   . Diverticulitis     Past Surgical History  Procedure Laterality Date  . Breast biopsy Right     neg  . Abdominal hysterectomy    . Small intestine surgery      Family History  Problem Relation Age of Onset  . Hypertension Mother   . Cirrhosis Mother   . Hypertension Father   . Diabetes Father   . Kidney failure Father     Social History:  reports that she has never smoked. She has never used smokeless tobacco. She reports that she does not drink alcohol or use illicit drugs.  Allergies: No Known Allergies  Medications reviewed.    ROS  A multisystem review of systems was completed. All pertinent positives negatives within the history of present illness vent were negative.  BP 161/89 mmHg  Pulse 66  Temp(Src) 98.1 F (36.7 C) (Oral)  Ht 5' (1.524 m)  Wt 64.229 kg (141 lb 9.6 oz)  BMI 27.65 kg/m2  Physical Exam  Gen.: No acute distress Chest: Clear to auscultation Heart: Regular rate and rhythm Abdomen: Soft, nontender, nondistended.   No results found for this or any previous visit (from the past 48 hour(s)). No results found.  Assessment/Plan:  1. Diverticulitis of large intestine with perforation without bleeding 69 year old female who was  recently admitted for diverticulitis with contained perforation. She has done well with antibiotic treatment. Unable to review path colonoscopy results during this visit. Discussed that with her colonoscopy that was performed 2 years ago that if it showed diverticulosis is okay to wait for her normal scheduled repeat colonoscopy that was to be done 3 years from now. If her prior colonoscopy did not show diverticulosis with then recommend earlier colonoscopy done in 6 weeks. Patient voiced understanding. Clinic staff will call her with her results and inform her whether or not she can wait or she will need GI referral. Otherwise she'll follow up when necessary     Tracy Haas  03/20/2015,negative

## 2015-03-20 NOTE — Telephone Encounter (Signed)
[facility]

## 2015-03-20 NOTE — Telephone Encounter (Signed)
-----   Message from Tracy PentonStephen M Holmes, RN sent at 03/20/2015 11:31 AM EDT ----- Regarding: colonoscopy referral Dr Tonita CongWoodham would like the patient to have a colonoscopy within the next 6 weeks to assess her diverticulitis resolution.

## 2015-04-15 ENCOUNTER — Other Ambulatory Visit: Payer: Self-pay

## 2015-04-16 ENCOUNTER — Encounter: Payer: Self-pay | Admitting: *Deleted

## 2015-04-16 ENCOUNTER — Ambulatory Visit: Payer: Medicare Other | Admitting: Anesthesiology

## 2015-04-16 ENCOUNTER — Ambulatory Visit
Admission: RE | Admit: 2015-04-16 | Discharge: 2015-04-16 | Disposition: A | Discharge status: Home / Self Care | Payer: Medicare Other | Source: Ambulatory Visit | Attending: Gastroenterology | Admitting: Gastroenterology

## 2015-04-16 ENCOUNTER — Encounter
Admission: RE | Disposition: A | Discharge status: Home / Self Care | Payer: Self-pay | Source: Ambulatory Visit | Attending: Gastroenterology

## 2015-04-16 ENCOUNTER — Ambulatory Visit: Admit: 2015-04-16 | Payer: Self-pay | Admitting: Gastroenterology

## 2015-04-16 DIAGNOSIS — K5669 Other intestinal obstruction: Secondary | ICD-10-CM | POA: Insufficient documentation

## 2015-04-16 DIAGNOSIS — Z9889 Other specified postprocedural states: Secondary | ICD-10-CM | POA: Diagnosis not present

## 2015-04-16 DIAGNOSIS — Z8249 Family history of ischemic heart disease and other diseases of the circulatory system: Secondary | ICD-10-CM | POA: Diagnosis not present

## 2015-04-16 DIAGNOSIS — Z90710 Acquired absence of both cervix and uterus: Secondary | ICD-10-CM | POA: Diagnosis not present

## 2015-04-16 DIAGNOSIS — K640 First degree hemorrhoids: Secondary | ICD-10-CM | POA: Diagnosis not present

## 2015-04-16 DIAGNOSIS — Z79899 Other long term (current) drug therapy: Secondary | ICD-10-CM | POA: Insufficient documentation

## 2015-04-16 DIAGNOSIS — I10 Essential (primary) hypertension: Secondary | ICD-10-CM | POA: Diagnosis not present

## 2015-04-16 DIAGNOSIS — K5732 Diverticulitis of large intestine without perforation or abscess without bleeding: Secondary | ICD-10-CM | POA: Insufficient documentation

## 2015-04-16 DIAGNOSIS — E7800 Pure hypercholesterolemia, unspecified: Secondary | ICD-10-CM | POA: Diagnosis not present

## 2015-04-16 DIAGNOSIS — Z791 Long term (current) use of non-steroidal anti-inflammatories (NSAID): Secondary | ICD-10-CM | POA: Diagnosis not present

## 2015-04-16 DIAGNOSIS — Z833 Family history of diabetes mellitus: Secondary | ICD-10-CM | POA: Insufficient documentation

## 2015-04-16 DIAGNOSIS — K56699 Other intestinal obstruction unspecified as to partial versus complete obstruction: Secondary | ICD-10-CM | POA: Insufficient documentation

## 2015-04-16 DIAGNOSIS — K5792 Diverticulitis of intestine, part unspecified, without perforation or abscess without bleeding: Secondary | ICD-10-CM | POA: Diagnosis present

## 2015-04-16 DIAGNOSIS — Z841 Family history of disorders of kidney and ureter: Secondary | ICD-10-CM | POA: Insufficient documentation

## 2015-04-16 DIAGNOSIS — K5733 Diverticulitis of large intestine without perforation or abscess with bleeding: Secondary | ICD-10-CM | POA: Diagnosis not present

## 2015-04-16 HISTORY — PX: COLONOSCOPY WITH PROPOFOL: SHX5780

## 2015-04-16 SURGERY — COLONOSCOPY WITH PROPOFOL
Anesthesia: General

## 2015-04-16 MED ORDER — LIDOCAINE HCL (CARDIAC) 20 MG/ML IV SOLN
INTRAVENOUS | Status: DC | PRN
  Administered 2015-04-16: 80 mg via INTRAVENOUS

## 2015-04-16 MED ORDER — SODIUM CHLORIDE 0.9 % IV SOLN
INTRAVENOUS | Status: DC
  Administered 2015-04-16: 11:00:00 via INTRAVENOUS

## 2015-04-16 MED ORDER — SODIUM CHLORIDE 0.9 % IV SOLN: INTRAVENOUS | Status: DC

## 2015-04-16 MED ORDER — MIDAZOLAM HCL 2 MG/2ML IJ SOLN
INTRAMUSCULAR | Status: DC | PRN
  Administered 2015-04-16: 2 mg via INTRAVENOUS

## 2015-04-16 MED ORDER — PROPOFOL 500 MG/50ML IV EMUL
INTRAVENOUS | Status: DC | PRN
  Administered 2015-04-16: 160 ug/kg/min via INTRAVENOUS

## 2015-04-16 NOTE — Op Note (Signed)
[facility] Gastroenterology Patient Name: Tracy LameMary Haas Procedure Date: 04/16/2015 11:21 AM MRN: [id number] Account #: [id number] Date of Birth: Aug 11, 1945 Admit Type: Outpatient Age: [age] [facility] ENDO ROOM 4 Gender: Female Note Status: Finalized Procedure:         Colonoscopy Indications:       Follow-up of diverticulitis Providers:         Midge Miniumarren Yarnell Arvidson, MD Referring MD:      Danella PentonMark F. Miller, MD (Referring MD) Medicines:         Propofol per Anesthesia Complications:     No immediate complications. Procedure:         Pre-Anesthesia Assessment:                    - Prior to the procedure, a History and Physical was                     performed, and patient medications and allergies were                     reviewed. The patient's tolerance of previous anesthesia                     was also reviewed. The risks and benefits of the procedure                     and the sedation options and risks were discussed with the                     patient. All questions were answered, and informed consent                     was obtained. Prior Anticoagulants: The patient has taken                     no previous anticoagulant or antiplatelet agents. ASA                     Grade Assessment: II - A patient with mild systemic                     disease. After reviewing the risks and benefits, the                     patient was deemed in satisfactory condition to undergo                     the procedure.                    After obtaining informed consent, the colonoscope was                     passed under direct vision. Throughout the procedure, the                     patient's blood pressure, pulse, and oxygen saturations                     were monitored continuously. The Colonoscope was                     introduced through the anus and advanced to the the cecum,  identified by appendiceal orifice and ileocecal valve. The   colonoscopy was performed without difficulty. The patient                     tolerated the procedure well. The quality of the bowel                     preparation was excellent. Findings:      The perianal and digital rectal examinations were normal.      A benign-appearing, intrinsic moderate stenosis was found in the sigmoid       colon and was traversed. Biopsies were taken with a cold forceps for       histology.      Non-bleeding internal hemorrhoids were found during retroflexion. The       hemorrhoids were Grade I (internal hemorrhoids that do not prolapse).      The sigmoid narrowing appeared to be from inflammation Impression:        - Stricture in the sigmoid colon. Biopsied.                    - Non-bleeding internal hemorrhoids.                    - The sigmoid narrowing appeared to be from inflammation Recommendation:    - Await pathology results. Procedure Code(s): --- Professional ---                    [phone], Colonoscopy, flexible; with biopsy, single or                     multiple Diagnosis Code(s): --- Professional ---                    K57.32, Diverticulitis of large intestine without                     perforation or abscess without bleeding                    K56.69, Other intestinal obstruction                    K64.0, First degree hemorrhoids CPT copyright 2014 American Medical Association. All rights reserved. The codes documented in this report are preliminary and upon coder review may  be revised to meet current compliance requirements. Midge Minium, MD 04/16/2015 11:38:24 AM This report has been signed electronically. Number of Addenda: 0 Note Initiated On: 04/16/2015 11:21 AM Scope Withdrawal Time: 0 hours 2 minutes 46 seconds  Total Procedure Duration: 0 hours 8 minutes 34 seconds       [facility]

## 2015-04-16 NOTE — Transfer of Care (Signed)
Immediate Anesthesia Transfer of Care Note  Patient: Tracy Haas  Procedure(s) Performed: Procedure(s): COLONOSCOPY WITH PROPOFOL (N/A)  Patient Location: PACU, SICU and Endoscopy Unit  Anesthesia Type:General  Level of Consciousness: sedated  Airway & Oxygen Therapy: Patient Spontanous Breathing and Patient connected to nasal cannula oxygen  Post-op Assessment: Report given to RN and Post -op Vital signs reviewed and stable  Post vital signs: Reviewed and stable  Last Vitals: 11:39 117/73 87 hr 97.4 temp 18 resp 96% sat  Filed Vitals:   04/16/15 1046  BP: 145/93  Pulse: 96  Temp: 36.8 C  Resp: 20    Complications: No apparent anesthesia complications

## 2015-04-16 NOTE — H&P (Signed)
  [facility]  [address] [name], [place] [address] Phone: [phone] Fax : [phone]  Primary Care Physician:  Danella PentonMILLER,MARK F., MD Primary Gastroenterologist:  Dr. Servando SnareWohl  Pre-Procedure History & Physical: HPI:  Tracy Haas is a 69 y.o. female is here for an colonoscopy.   Past Medical History  Diagnosis Date  . Hypertension   . High cholesterol   . Diverticulitis     Past Surgical History  Procedure Laterality Date  . Breast biopsy Right     neg  . Abdominal hysterectomy    . Small intestine surgery      Prior to Admission medications   Medication Sig Start Date End Date Taking? Authorizing Provider  ALPRAZolam Prudy Feeler(XANAX) 0.5 MG tablet TAKE ONE TABLET BY MOUTH ONCE DAILY AS NEEDED 06/14/14   Historical Provider, MD  bisoprolol-hydrochlorothiazide (ZIAC) 10-6.25 MG tablet Take 1 tablet by mouth daily. 01/26/12   Historical Provider, MD  Calcium 500-125 MG-UNIT TABS Take by mouth.    Historical Provider, MD  Cyanocobalamin (RA VITAMIN B-12 TR) 1000 MCG TBCR Take 1 tablet by mouth daily.    Historical Provider, MD  ibuprofen (ADVIL,MOTRIN) 200 MG tablet Take by mouth.    Historical Provider, MD  levothyroxine (SYNTHROID, LEVOTHROID) 75 MCG tablet Take 1 tablet by mouth daily. 01/26/12   Historical Provider, MD  omeprazole (PRILOSEC) 20 MG capsule Take 1 capsule by mouth daily. 12/17/14   Historical Provider, MD  ramipril (ALTACE) 2.5 MG capsule Take 2.5 mg by mouth daily. 01/26/12   Historical Provider, MD  simvastatin (ZOCOR) 20 MG tablet Take 20 mg by mouth daily. 01/26/12   Historical Provider, MD    Allergies as of 04/15/2015  . (No Known Allergies)    Family History  Problem Relation Age of Onset  . Hypertension Mother   . Cirrhosis Mother   . Hypertension Father   . Diabetes Father   . Kidney failure Father     Social History   Social History  . Marital Status: Widowed    Spouse Name: N/A  . Number of Children: N/A  . Years of  Education: N/A   Occupational History  . Not on file.   Social History Main Topics  . Smoking status: Never Smoker   . Smokeless tobacco: Never Used  . Alcohol Use: No  . Drug Use: No  . Sexual Activity: Not on file   Other Topics Concern  . Not on file   Social History Narrative    Review of Systems: See HPI, otherwise negative ROS  Physical Exam: BP 117/73 mmHg  Pulse 80  Temp(Src) 97.4 F (36.3 C) (Tympanic)  Resp 17  Ht 5' (1.524 m)  Wt 141 lb (63.957 kg)  BMI 27.54 kg/m2  SpO2 99% General:   Alert,  pleasant and cooperative in NAD Head:  Normocephalic and atraumatic. Neck:  Supple; no masses or thyromegaly. Lungs:  Clear throughout to auscultation.    Heart:  Regular rate and rhythm. Abdomen:  Soft, nontender and nondistended. Normal bowel sounds, without guarding, and without rebound.   Neurologic:  Alert and  oriented x4;  grossly normal neurologically.  Impression/Plan: Tracy Haas is here for an colonoscopy to be performed for follow up for diverticulitis.  Risks, benefits, limitations, and alternatives regarding  colonoscopy have been reviewed with the patient.  Questions have been answered.  All parties agreeable.   Darlina Rumpfaren Kalsey Lull, MD  04/16/2015, 11:42 AM

## 2015-04-16 NOTE — Anesthesia Preprocedure Evaluation (Signed)
Anesthesia Evaluation  Patient identified by MRN, date of birth, ID band Patient awake    Reviewed: Allergy & Precautions, H&P , NPO status , Patient's Chart, lab work & pertinent test results, reviewed documented beta blocker date and time   Airway Mallampati: II  TM Distance: >3 FB Neck ROM: full    Dental no notable dental hx.    Pulmonary neg pulmonary ROS,    Pulmonary exam normal breath sounds clear to auscultation       Cardiovascular Exercise Tolerance: Good hypertension, negative cardio ROS   Rhythm:regular Rate:Normal     Neuro/Psych negative neurological ROS  negative psych ROS   GI/Hepatic negative GI ROS, Neg liver ROS,   Endo/Other  negative endocrine ROSHypothyroidism   Renal/GU negative Renal ROS  negative genitourinary   Musculoskeletal   Abdominal   Peds  Hematology negative hematology ROS (+)   Anesthesia Other Findings   Reproductive/Obstetrics negative OB ROS                             Anesthesia Physical Anesthesia Plan  ASA: II  Anesthesia Plan: General   Post-op Pain Management:    Induction:   Airway Management Planned:   Additional Equipment:   Intra-op Plan:   Post-operative Plan:   Informed Consent: I have reviewed the patients History and Physical, chart, labs and discussed the procedure including the risks, benefits and alternatives for the proposed anesthesia with the patient or authorized representative who has indicated his/her understanding and acceptance.   Dental Advisory Given  Plan Discussed with: CRNA  Anesthesia Plan Comments:         Anesthesia Quick Evaluation

## 2015-04-17 ENCOUNTER — Encounter: Payer: Self-pay | Admitting: Gastroenterology

## 2015-04-17 LAB — SURGICAL PATHOLOGY

## 2015-04-17 NOTE — Anesthesia Postprocedure Evaluation (Signed)
Anesthesia Post Note  Patient: Royetta CarMary K Lassen  Procedure(s) Performed: Procedure(s) (LRB): COLONOSCOPY WITH PROPOFOL (N/A)  Patient location during evaluation: PACU Anesthesia Type: General Level of consciousness: awake and alert Pain management: pain level controlled Vital Signs Assessment: post-procedure vital signs reviewed and stable Respiratory status: spontaneous breathing, nonlabored ventilation, respiratory function stable and patient connected to nasal cannula oxygen Cardiovascular status: blood pressure returned to baseline and stable Postop Assessment: no signs of nausea or vomiting Anesthetic complications: no    Last Vitals:  Filed Vitals:   04/16/15 1220 04/16/15 1230  BP: 127/71 133/74  Pulse: 74 72  Temp:    Resp: 18 19    Last Pain:  Filed Vitals:   04/17/15 0837  PainSc: 0-No pain                 Yevette EdwardsJames G Jeremaine Maraj

## 2015-04-18 ENCOUNTER — Encounter: Payer: Self-pay | Admitting: Gastroenterology

## 2015-10-09 ENCOUNTER — Other Ambulatory Visit: Payer: Self-pay | Admitting: Internal Medicine

## 2015-10-09 DIAGNOSIS — Z1231 Encounter for screening mammogram for malignant neoplasm of breast: Secondary | ICD-10-CM

## 2015-12-11 ENCOUNTER — Ambulatory Visit
Admission: RE | Admit: 2015-12-11 | Discharge: 2015-12-11 | Disposition: A | Discharge status: Home / Self Care | Payer: Medicare Other | Source: Ambulatory Visit | Attending: Internal Medicine | Admitting: Internal Medicine

## 2015-12-11 ENCOUNTER — Other Ambulatory Visit: Payer: Self-pay | Admitting: Internal Medicine

## 2015-12-11 DIAGNOSIS — Z1231 Encounter for screening mammogram for malignant neoplasm of breast: Secondary | ICD-10-CM | POA: Insufficient documentation

## 2015-12-11 DIAGNOSIS — R928 Other abnormal and inconclusive findings on diagnostic imaging of breast: Secondary | ICD-10-CM | POA: Insufficient documentation

## 2015-12-13 ENCOUNTER — Other Ambulatory Visit: Payer: Self-pay | Admitting: Internal Medicine

## 2015-12-13 DIAGNOSIS — N6320 Unspecified lump in the left breast, unspecified quadrant: Secondary | ICD-10-CM

## 2015-12-20 ENCOUNTER — Ambulatory Visit
Admission: RE | Admit: 2015-12-20 | Discharge: 2015-12-20 | Disposition: A | Discharge status: Home / Self Care | Payer: Medicare Other | Source: Ambulatory Visit | Attending: Internal Medicine | Admitting: Internal Medicine

## 2015-12-20 DIAGNOSIS — N63 Unspecified lump in breast: Secondary | ICD-10-CM | POA: Insufficient documentation

## 2015-12-20 DIAGNOSIS — N6320 Unspecified lump in the left breast, unspecified quadrant: Secondary | ICD-10-CM

## 2016-03-20 DIAGNOSIS — M65331 Trigger finger, right middle finger: Secondary | ICD-10-CM | POA: Insufficient documentation

## 2016-04-07 ENCOUNTER — Encounter: Payer: Self-pay | Admitting: *Deleted

## 2016-04-15 ENCOUNTER — Ambulatory Visit: Payer: Medicare Other | Admitting: Anesthesiology

## 2016-04-15 ENCOUNTER — Ambulatory Visit
Admission: RE | Admit: 2016-04-15 | Discharge: 2016-04-15 | Disposition: A | Discharge status: Home / Self Care | Payer: Medicare Other | Source: Ambulatory Visit | Attending: Surgery | Admitting: Surgery

## 2016-04-15 ENCOUNTER — Encounter
Admission: RE | Disposition: A | Discharge status: Home / Self Care | Payer: Self-pay | Source: Ambulatory Visit | Attending: Surgery

## 2016-04-15 DIAGNOSIS — I10 Essential (primary) hypertension: Secondary | ICD-10-CM | POA: Insufficient documentation

## 2016-04-15 DIAGNOSIS — K219 Gastro-esophageal reflux disease without esophagitis: Secondary | ICD-10-CM | POA: Diagnosis not present

## 2016-04-15 DIAGNOSIS — E785 Hyperlipidemia, unspecified: Secondary | ICD-10-CM | POA: Insufficient documentation

## 2016-04-15 DIAGNOSIS — Z79899 Other long term (current) drug therapy: Secondary | ICD-10-CM | POA: Diagnosis not present

## 2016-04-15 DIAGNOSIS — M65331 Trigger finger, right middle finger: Secondary | ICD-10-CM | POA: Diagnosis present

## 2016-04-15 DIAGNOSIS — E039 Hypothyroidism, unspecified: Secondary | ICD-10-CM | POA: Diagnosis not present

## 2016-04-15 HISTORY — DX: Gastro-esophageal reflux disease without esophagitis: K21.9

## 2016-04-15 HISTORY — PX: TRIGGER FINGER RELEASE: SHX641

## 2016-04-15 HISTORY — DX: Hypothyroidism, unspecified: E03.9

## 2016-04-15 SURGERY — RELEASE, A1 PULLEY, FOR TRIGGER FINGER
Anesthesia: Regional | Laterality: Right | Wound class: Clean

## 2016-04-15 MED ORDER — OXYCODONE HCL 5 MG PO TABS: 5.0000 mg | ORAL_TABLET | Freq: Once | ORAL | Status: DC | PRN

## 2016-04-15 MED ORDER — OXYCODONE HCL 5 MG/5ML PO SOLN: 5.0000 mg | Freq: Once | ORAL | Status: DC | PRN

## 2016-04-15 MED ORDER — FENTANYL CITRATE (PF) 100 MCG/2ML IJ SOLN
INTRAMUSCULAR | Status: DC | PRN
  Administered 2016-04-15 (×2): 50 ug via INTRAVENOUS

## 2016-04-15 MED ORDER — HYDROCODONE-ACETAMINOPHEN 5-325 MG PO TABS: 1.0000 | ORAL_TABLET | ORAL | 0 refills | Status: DC | PRN

## 2016-04-15 MED ORDER — FENTANYL CITRATE (PF) 100 MCG/2ML IJ SOLN: 25.0000 ug | INTRAMUSCULAR | Status: DC | PRN

## 2016-04-15 MED ORDER — CEFAZOLIN SODIUM-DEXTROSE 2-4 GM/100ML-% IV SOLN
2.0000 g | Freq: Once | INTRAVENOUS | Status: AC
  Administered 2016-04-15: 2 g via INTRAVENOUS

## 2016-04-15 MED ORDER — PROPOFOL 500 MG/50ML IV EMUL
INTRAVENOUS | Status: DC | PRN
Start: 2016-04-15 — End: 2016-04-15
  Administered 2016-04-15: 25 ug/kg/min via INTRAVENOUS

## 2016-04-15 MED ORDER — LACTATED RINGERS IV SOLN
INTRAVENOUS | Status: DC
  Administered 2016-04-15: 12:00:00 via INTRAVENOUS

## 2016-04-15 MED ORDER — ONDANSETRON HCL 4 MG/2ML IJ SOLN: 4.0000 mg | Freq: Once | INTRAMUSCULAR | Status: DC | PRN

## 2016-04-15 MED ORDER — LIDOCAINE HCL (CARDIAC) 20 MG/ML IV SOLN
INTRAVENOUS | Status: DC | PRN
  Administered 2016-04-15: 50 mg via INTRAVENOUS

## 2016-04-15 MED ORDER — MIDAZOLAM HCL 2 MG/2ML IJ SOLN
INTRAMUSCULAR | Status: DC | PRN
  Administered 2016-04-15: 2 mg via INTRAVENOUS

## 2016-04-15 MED ORDER — BUPIVACAINE HCL (PF) 0.5 % IJ SOLN
INTRAMUSCULAR | Status: DC | PRN
  Administered 2016-04-15: 5 mL

## 2016-04-15 SURGICAL SUPPLY — 24 items
BANDAGE ELASTIC 2 LF NS (GAUZE/BANDAGES/DRESSINGS) ×3 IMPLANT
BANDAGE ELASTIC 3 LF NS (GAUZE/BANDAGES/DRESSINGS) IMPLANT
BNDG ESMARK 4X12 TAN STRL LF (GAUZE/BANDAGES/DRESSINGS) ×3 IMPLANT
CHLORAPREP W/TINT 26ML (MISCELLANEOUS) ×3 IMPLANT
CORD BIP STRL DISP 12FT (MISCELLANEOUS) IMPLANT
COVER LIGHT HANDLE UNIVERSAL (MISCELLANEOUS) ×6 IMPLANT
CUFF TOURNIQUET DUAL PORT 18X3 (MISCELLANEOUS) ×3 IMPLANT
DECANTER SPIKE VIAL GLASS SM (MISCELLANEOUS) ×3 IMPLANT
GAUZE PETRO XEROFOAM 1X8 (MISCELLANEOUS) ×3 IMPLANT
GAUZE SPONGE 4X4 12PLY STRL (GAUZE/BANDAGES/DRESSINGS) ×3 IMPLANT
GLOVE BIO SURGEON STRL SZ8 (GLOVE) ×6 IMPLANT
GLOVE INDICATOR 8.0 STRL GRN (GLOVE) ×3 IMPLANT
GOWN STRL REUS W/ TWL LRG LVL3 (GOWN DISPOSABLE) ×1 IMPLANT
GOWN STRL REUS W/ TWL XL LVL3 (GOWN DISPOSABLE) ×1 IMPLANT
GOWN STRL REUS W/TWL LRG LVL3 (GOWN DISPOSABLE) ×2
GOWN STRL REUS W/TWL XL LVL3 (GOWN DISPOSABLE) ×2
KIT ROOM TURNOVER OR (KITS) ×3 IMPLANT
NS IRRIG 500ML POUR BTL (IV SOLUTION) ×3 IMPLANT
PACK EXTREMITY ARMC (MISCELLANEOUS) ×3 IMPLANT
STOCKINETTE IMPERVIOUS 9X36 MD (GAUZE/BANDAGES/DRESSINGS) ×3 IMPLANT
STRAP BODY AND KNEE 60X3 (MISCELLANEOUS) ×3 IMPLANT
SUT PROLENE 4 0 PS 2 18 (SUTURE) ×3 IMPLANT
SUT VIC AB 3-0 SH 27 (SUTURE)
SUT VIC AB 3-0 SH 27X BRD (SUTURE) IMPLANT

## 2016-04-15 NOTE — H&P (Signed)
Paper H&P to be scanned into permanent record. H&P reviewed. No changes. 

## 2016-04-15 NOTE — Transfer of Care (Signed)
Immediate Anesthesia Transfer of Care Note  Patient: Tracy Haas  Procedure(s) Performed: Procedure(s): RELEASE TRIGGER FINGER/A-1 PULLEY (Right)  Patient Location: PACU  Anesthesia Type: Bier Block  Level of Consciousness: awake, alert  and patient cooperative  Airway and Oxygen Therapy: Patient Spontanous Breathing and Patient connected to supplemental oxygen  Post-op Assessment: Post-op Vital signs reviewed, Patient's Cardiovascular Status Stable, Respiratory Function Stable, Patent Airway and No signs of Nausea or vomiting  Post-op Vital Signs: Reviewed and stable  Complications: No apparent anesthesia complications

## 2016-04-15 NOTE — Anesthesia Procedure Notes (Signed)
Procedure Name: MAC Performed by: Sheniya Garciaperez Pre-anesthesia Checklist: Patient identified, Emergency Drugs available, Suction available, Timeout performed and Patient being monitored Patient Re-evaluated:Patient Re-evaluated prior to inductionOxygen Delivery Method: Nasal cannula Placement Confirmation: positive ETCO2     

## 2016-04-15 NOTE — Op Note (Signed)
04/15/2016  12:58 PM  Patient:   Tracy Haas  Pre-Op Diagnosis:   Right long trigger finger.  Post-Op Diagnosis:   Same  Procedure:   Release right long trigger finger.  Surgeon:   Maryagnes AmosJ. Jeffrey Azlan Hanway, MD  Assistant:   None  Anesthesia:   Bier block  Findings:   As above.  Complications:   None  EBL:   0 cc  Fluids:   500 cc crystalloid  TT:   24 minutes at 250 mmHg  Drains:   None  Closure:   4-0 Prolene  Implants:   None  Brief Clinical Note:   The patient is a 70 year old female with a history of progressively worsening painful catching of her right long finger. These symptoms have progressed despite medications, activity modification, etc. The patient's history and examination were consistent with a right long trigger finger. The patient presents at this time for a right long trigger finger release.  Procedure:   The patient was brought into the operating room and lain in the supine position. After adequate IV sedation was achieved, a timeout was performed to verify the appropriate surgical site. A Bier block was placed by the anesthesiologist and the tourniquet inflated to 250 mmHg. The right hand and upper extremity were prepped with ChloraPrep solution before being draped sterilely. Preoperative antibiotics were administered. An approximately 1.5-2.0 cm incision was made over the volar aspect of the right long finger at the level of the metacarpal head centered over the flexor sheath. The incision was carried down through the subcutaneous tissues with care taken to identify and protect the digital neurovascular structures. The flexor sheath was entered just proximal to the A1 pulley. The sheath was released proximally for several centimeters under direct visualization. Distally, a clamp was placed beneath the A1 pulley and used to release any adhesions. The clamp was repositioned so that one jaw was superficial to and the other jaw deep to the A1 pulley. The A1 pulley was  incised on either side of the clamp to remove a 2 mm strip of tissue. Metzenbaum scissors was used to ensure complete release of the A1 pulley more distally. The underlying tendons were carefully inspected and found to be intact.   The wound was copiously irrigated with sterile saline solution before the wound was closed using 4-0 Prolene interrupted sutures. A total of 10 cc of 0.5% plain Sensorcaine was injected in and around the incision to help with postoperative analgesia before a sterile bulky dressing was applied to the hand. The patient was then awakened and returned to the recovery room in satisfactory condition after tolerating the procedure well.

## 2016-04-15 NOTE — Anesthesia Postprocedure Evaluation (Signed)
Anesthesia Post Note  Patient: Tracy Haas  Procedure(s) Performed: Procedure(s) (LRB): RELEASE TRIGGER FINGER/A-1 PULLEY (Right)  Patient location during evaluation: PACU Anesthesia Type: MAC and Bier Block Level of consciousness: awake and alert Pain management: pain level controlled Vital Signs Assessment: post-procedure vital signs reviewed and stable Respiratory status: spontaneous breathing, nonlabored ventilation, respiratory function stable and patient connected to nasal cannula oxygen Cardiovascular status: stable and blood pressure returned to baseline Anesthetic complications: no    Scarlette Sliceachel B Beach

## 2016-04-15 NOTE — Anesthesia Procedure Notes (Signed)
Anesthesia Regional Block:  Bier block (IV Regional)  Pre-Anesthetic Checklist: ,, timeout performed, Correct Patient, Correct Site, Correct Laterality, Correct Procedure, Correct Position, site marked, Risks and benefits discussed, Surgical consent,  Pre-op evaluation,  At surgeon's request  Laterality: Right     Bier block (IV Regional) Narrative:  Start time: 04/15/2016 12:23 PM End time: 04/15/2016 1:04 PM Injection made incrementally with aspirations every 33 mL.  Performed by: Personally  Anesthesiologist: BEACH, RACHEL B

## 2016-04-15 NOTE — Anesthesia Preprocedure Evaluation (Signed)
Anesthesia Evaluation  Patient identified by MRN, date of birth, ID band Patient awake    Reviewed: Allergy & Precautions, H&P , NPO status , Patient's Chart, lab work & pertinent test results, reviewed documented beta blocker date and time   Airway Mallampati: II  TM Distance: >3 FB Neck ROM: full    Dental no notable dental hx.    Pulmonary neg pulmonary ROS,    Pulmonary exam normal breath sounds clear to auscultation       Cardiovascular Exercise Tolerance: Good hypertension,  Rhythm:regular Rate:Normal     Neuro/Psych negative neurological ROS  negative psych ROS   GI/Hepatic Neg liver ROS, GERD  ,  Endo/Other  Hypothyroidism   Renal/GU negative Renal ROS  negative genitourinary   Musculoskeletal   Abdominal   Peds  Hematology negative hematology ROS (+)   Anesthesia Other Findings   Reproductive/Obstetrics negative OB ROS                             Anesthesia Physical Anesthesia Plan  ASA: II  Anesthesia Plan: Bier Block   Post-op Pain Management:    Induction:   Airway Management Planned:   Additional Equipment:   Intra-op Plan:   Post-operative Plan:   Informed Consent: I have reviewed the patients History and Physical, chart, labs and discussed the procedure including the risks, benefits and alternatives for the proposed anesthesia with the patient or authorized representative who has indicated his/her understanding and acceptance.   Dental Advisory Given  Plan Discussed with: CRNA  Anesthesia Plan Comments:         Anesthesia Quick Evaluation

## 2016-04-15 NOTE — Discharge Instructions (Signed)
General Anesthesia, Adult, Care After These instructions provide you with information about caring for yourself after your procedure. Your health care provider may also give you more specific instructions. Your treatment has been planned according to current medical practices, but problems sometimes occur. Call your health care provider if you have any problems or questions after your procedure. What can I expect after the procedure? After the procedure, it is common to have:  Vomiting.  A sore throat.  Mental slowness. It is common to feel:  Nauseous.  Cold or shivery.  Sleepy.  Tired.  Sore or achy, even in parts of your body where you did not have surgery. Follow these instructions at home: For at least 24 hours after the procedure:  Do not:  Participate in activities where you could fall or become injured.  Drive.  Use heavy machinery.  Drink alcohol.  Take sleeping pills or medicines that cause drowsiness.  Make important decisions or sign legal documents.  Take care of children on your own.  Rest. Eating and drinking  If you vomit, drink water, juice, or soup when you can drink without vomiting.  Drink enough fluid to keep your urine clear or pale yellow.  Make sure you have little or no nausea before eating solid foods.  Follow the diet recommended by your health care provider. General instructions  Have a responsible adult stay with you until you are awake and alert.  Return to your normal activities as told by your health care provider. Ask your health care provider what activities are safe for you.  Take over-the-counter and prescription medicines only as told by your health care provider.  If you smoke, do not smoke without supervision.  Keep all follow-up visits as told by your health care provider. This is important. Contact a health care provider if:  You continue to have nausea or vomiting at home, and medicines are not helpful.  You  cannot drink fluids or start eating again.  You cannot urinate after 8-12 hours.  You develop a skin rash.  You have fever.  You have increasing redness at the site of your procedure. Get help right away if:  You have difficulty breathing.  You have chest pain.  You have unexpected bleeding.  You feel that you are having a life-threatening or urgent problem. This information is not intended to replace advice given to you by your health care provider. Make sure you discuss any questions you have with your health care provider. Document Released: 08/10/2000 Document Revised: 10/07/2015 Document Reviewed: 04/18/2015 Elsevier Interactive Patient Education  2017 [employer]  Keep dressing dry and intact. Keep hand elevated above heart level. May shower after dressing removed on postop day 4 (Sunday). Cover sutures with Band-Aids after drying off. Apply ice to affected area frequently. Take ibuprofen 600 mg TID with meals for 7-10 days, then as necessary. Take pain medication as prescribed or ES Tylenol if needed.  Return for follow-up in 10-14 days or as scheduled.

## 2016-04-16 ENCOUNTER — Encounter: Payer: Self-pay | Admitting: Surgery

## 2016-06-19 ENCOUNTER — Other Ambulatory Visit: Payer: Self-pay | Admitting: Internal Medicine

## 2016-06-19 DIAGNOSIS — D72829 Elevated white blood cell count, unspecified: Secondary | ICD-10-CM

## 2016-06-19 DIAGNOSIS — K5792 Diverticulitis of intestine, part unspecified, without perforation or abscess without bleeding: Secondary | ICD-10-CM

## 2016-06-23 ENCOUNTER — Ambulatory Visit
Admission: RE | Admit: 2016-06-23 | Discharge: 2016-06-23 | Disposition: A | Discharge status: Home / Self Care | Payer: Medicare Other | Source: Ambulatory Visit | Attending: Internal Medicine | Admitting: Internal Medicine

## 2016-06-23 DIAGNOSIS — D72829 Elevated white blood cell count, unspecified: Secondary | ICD-10-CM | POA: Diagnosis not present

## 2016-06-23 DIAGNOSIS — K630 Abscess of intestine: Secondary | ICD-10-CM | POA: Diagnosis not present

## 2016-06-23 DIAGNOSIS — K5732 Diverticulitis of large intestine without perforation or abscess without bleeding: Secondary | ICD-10-CM | POA: Insufficient documentation

## 2016-06-23 DIAGNOSIS — K5792 Diverticulitis of intestine, part unspecified, without perforation or abscess without bleeding: Secondary | ICD-10-CM

## 2016-06-23 MED ORDER — IOPAMIDOL (ISOVUE-300) INJECTION 61%
100.0000 mL | Freq: Once | INTRAVENOUS | Status: AC | PRN
  Administered 2016-06-23: 100 mL via INTRAVENOUS

## 2016-06-29 ENCOUNTER — Other Ambulatory Visit: Payer: Self-pay

## 2016-06-30 ENCOUNTER — Encounter: Payer: Self-pay | Admitting: General Surgery

## 2016-06-30 ENCOUNTER — Ambulatory Visit (INDEPENDENT_AMBULATORY_CARE_PROVIDER_SITE_OTHER): Payer: Medicare Other | Admitting: General Surgery

## 2016-06-30 ENCOUNTER — Telehealth: Payer: Self-pay

## 2016-06-30 VITALS — BP 156/91 | HR 77 | Temp 98.1°F | Ht 60.0 in | Wt 136.2 lb

## 2016-06-30 DIAGNOSIS — K5720 Diverticulitis of large intestine with perforation and abscess without bleeding: Secondary | ICD-10-CM

## 2016-06-30 MED ORDER — ERYTHROMYCIN BASE 500 MG PO TABS: 1000.0000 mg | ORAL_TABLET | Freq: Three times a day (TID) | ORAL | 0 refills | Status: DC

## 2016-06-30 MED ORDER — NEOMYCIN SULFATE 500 MG PO TABS: 1000.0000 mg | ORAL_TABLET | Freq: Three times a day (TID) | ORAL | 0 refills | Status: DC

## 2016-06-30 MED ORDER — POLYETHYLENE GLYCOL 3350 17 GM/SCOOP PO POWD: 1.0000 | Freq: Once | ORAL | 0 refills | Status: AC

## 2016-06-30 MED ORDER — BISACODYL 5 MG PO TBEC: 20.0000 mg | DELAYED_RELEASE_TABLET | Freq: Once | ORAL | 0 refills | Status: AC

## 2016-06-30 NOTE — Patient Instructions (Signed)
We have seen you today to speak about removing a portion of your damaged Colon through the smaller incisions with the camera scope. We will arrange for this surgery to be done on 07/24/16 at [facility] by Dr. Tonita Cong.  You will need to complete a bowel prep prior to your surgery, please see the information sheet provided today for your directions.  Also, there will be 2 different antibiotics that you will need to take the day of your bowel prep: Neomycin and Erythromycin. You will take 2 tablets of each medication 3 times on the day of your bowel prep- 8am, 2pm, and 8pm.  Please see the (blue) Pre-care sheet for the details about your scheduled surgery.   Laparoscopic Colectomy Laparoscopic colectomy is surgery to remove part or all of the large intestine (colon). This procedure may be used to treat several conditions, including:  Inflammation and infection of the colon (diverticulitis).  Tumors or masses in the colon.  Inflammatory bowel disease, such as Crohn disease or ulcerative colitis. Colectomy is an option when symptoms cannot be controlled with medicines.  Bleeding from the colon that cannot be controlled by another method.  Blockage or obstruction of the colon. Tell a health care provider about:  Any allergies you have.  All medicines you are taking, including vitamins, herbs, eye drops, creams, and over-the-counter medicines.  Any problems you or family members have had with anesthetic medicines.  Any blood disorders you have.  Any surgeries you have had.  Any medical conditions you have. What are the risks? Generally, this is a safe procedure. However, problems may occur, including:  Infection.  Bleeding.  Allergic reactions to medicines or dyes.  Damage to other structures or organs.  Leaking from where the colon was sewn together.  Future blockage of the small intestines from scar tissue. Another surgery may be needed to repair this.  Needing to convert to an  open procedure. Complications such as damage to other organs or excessive bleeding may require the surgeon to convert from a laparoscopic procedure to an open procedure. This involves making a larger incision in the abdomen. What happens before the procedure? Staying hydrated  Follow instructions from your health care provider about hydration, which may include:  Up to 2 hours before the procedure - you may continue to drink clear liquids, such as water, clear fruit juice, black coffee, and plain tea. Eating and drinking restrictions  Follow instructions from your health care provider about eating and drinking, which may include:  8 hours before the procedure - stop eating heavy meals, meals with high fiber, or foods such as meat, fried foods, or fatty foods.  6 hours before the procedure - stop eating light meals or foods, such as toast or cereal.  6 hours before the procedure - stop drinking milk or drinks that contain milk.  2 hours before the procedure - stop drinking clear liquids. Medicines  Ask your health care provider about:  Changing or stopping your regular medicines. This is especially important if you are taking diabetes medicines or blood thinners.  Taking medicines such as aspirin and ibuprofen. These medicines can thin your blood. Do not take these medicines before your procedure if your health care provider instructs you not to.  You may be given antibiotic medicine to clean out bacteria from your colon. Follow the directions carefully and take the medicine at the correct time. General instructions  You may be prescribed an oral bowel prep to clean out your colon in preparation  for the surgery:  Follow instructions from your health care provider about how to do this.  Do not eat or drink anything else after you have started the bowel prep, unless your health care provider tells you it is safe to do so.  Do not use any products that contain nicotine or tobacco, such  as cigarettes and e-cigarettes. If you need help quitting, ask your health care provider. What happens during the procedure?  To reduce your risk of infection:  Your health care team will wash or sanitize their hands.  Your skin will be washed with soap.  An IV tube will be inserted into one of your veins to deliver fluid and medication.  You will be given one of the following:  A medicine to help you relax (sedative).  A medicine to make you fall asleep (general anesthetic).  Small monitors will be connected to your body. They will be used to check your heart, blood pressure, and oxygen level.  A breathing tube may be placed into your lungs during the procedure.  A thin, flexible tube (catheter) will be placed into your bladder to drain urine.  A tube may be placed through your nose and into your stomach to drain stomach fluids (nasogastric tube, or NG tube).  Your abdomen will be filled with air so it expands. This gives the surgeon more room to operate and makes your organs easier to see.  Several small cuts (incisions) will be made in your abdomen.  A thin, lighted tube with a tiny camera on the end (laparoscope) will be put through one of the small incisions. The camera on the laparoscope will send a picture to a computer screen in the operating room. This will give the surgeon a good view inside your abdomen.  Hollow tubes will be put through the other small incisions in your abdomen. The tools that are needed for the procedure will be put through these tubes.  Clamps or staples will be put on both ends of the diseased part of the colon.  The part of the intestine between the clamps or staples will be removed.  If possible, the ends of the healthy colon that remain will be stitched (sutured) or stapled together to allow your body to pass waste (stool).  Sometimes, the remaining colon cannot be stitched back together. If this is the case, a colostomy will be needed. If you  need a colostomy:  An opening to the outside of your body (stoma) will be made through your abdomen.  The end of your colon will be brought to the opening. It will be stitched to the skin.  A bag will be attached to the opening. Stool will drain into this removable bag.  The colostomy may be temporary or permanent.  The incisions from the colectomy will be closed with sutures or staples. The procedure may vary among health care providers and hospitals. What happens after the procedure?  Your blood pressure, heart rate, breathing rate, and blood oxygen level will be monitored until the medicines you were given have worn off.  You will receive fluids through an IV tube until your bowels start to work properly.  Once your bowels are working again, you will be given clear liquids first and then solid food as tolerated.  You will be given medicines to control your pain and nausea, if needed.  Do not drive for 24 hours if you were given a sedative. This information is not intended to replace advice given to  you by your health care provider. Make sure you discuss any questions you have with your health care provider. Document Released: 07/25/2002 Document Revised: 02/03/2016 Document Reviewed: 02/03/2016 Elsevier Interactive Patient Education  2017 [employer] Inc[employer]   Open Colectomy An open colectomy is surgery to remove part or all of the large intestine (colon). This procedure may be used to treat several conditions, including:  Inflammation and infection of the colon (diverticulitis).  Tumors or masses in the colon.  Inflammatory bowel disease, such as Crohn disease or ulcerative colitis.  Bleeding from the colon.  Blockage or obstruction of the colon. Tell a health care provider about:  Any allergies you have.  All medicines you are taking, including vitamins, herbs, eye drops, creams, and over-the-counter medicines.  Any problems you or family members have had with anesthetic  medicines.  Any blood disorders you have.  Any surgeries you have had.  Any medical conditions you have.  Whether you are pregnant or may be pregnant.  Whether you smoke or use tobacco products. These can affect your body's reaction to anesthesia. What are the risks? Generally, this is a safe procedure. However, problems may occur, including:  Infection.  Bleeding.  Allergic reactions to medicines.  Damage to other structures or organs.  Pneumonia.  The incision opening up.  Tissues from inside the abdomen bulging through the incision (hernia).  Reopening of the colon where it was stitched or stapled together.  A blood clot forming in a vein and traveling to the lungs.  Future blockage of the small intestine from scar tissue. What happens before the procedure? Staying hydrated  Follow instructions from your health care provider about hydration, which may include:  Up to 2 hours before the procedure - you may continue to drink clear liquids, such as water, clear fruit juice, black coffee, and plain tea. Eating and drinking restrictions  Follow instructions from your health care provider about eating and drinking, which may include:  8 hours before the procedure - stop eating heavy meals or foods such as meat, fried foods, or fatty foods.  6 hours before the procedure - stop eating light meals or foods, such as toast or cereal.  6 hours before the procedure - stop drinking milk or drinks that contain milk.  2 hours before the procedure - stop drinking clear liquids. Bowel prep  In some cases, you may be prescribed an oral bowel prep to clean out your colon. If so:  Take it as told by your health care provider. Starting the day before your procedure, you may need to drink a large amount of medicated liquid. The liquid will cause you to have multiple loose stools until your stool is almost clear or light green.  Follow instructions from your health care provider about  eating and drinking restrictions during bowel prep. Medicines  Ask your health care provider about:  Changing or stopping your regular medicines or vitamins. This is especially important if you are taking diabetes medicines, blood thinners, or vitamin E.  Taking medicines such as aspirin and ibuprofen. These medicines can thin your blood. Do not take these medicines before your procedure if your health care provider instructs you not to.  If you were prescribed an antibiotic medicine, take it as told by your health care provider. General instructions  Bring loose-fitting, comfortable clothing and slip-on shoes that you can put on without bending over.  Make sure to see your health care provider for any tests that you need before the procedure,  such as:  Blood tests.  A test to check the heart's rhythm (electrocardiogram, ECG).  A CT scan of your abdomen.  Urine tests.  Colonoscopy.  Plan to have someone take you home from the hospital or clinic.  Arrange for someone to help you with your activities during your recovery. What happens during the procedure?  To reduce your risk of infection:  Your health care team will wash or sanitize their hands.  Your skin will be washed with soap.  Hair may be removed from the surgical area.  An IV tube will be inserted into one of your veins. The tube will be used to give you medicines and fluids.  You will be given a medicine to make you fall asleep (general anesthetic). You may also be given a medicine to help you relax (sedative).  Small monitors will be connected to your body. They will be used to check your heart, blood pressure, and oxygen level.  A breathing tube may be placed into your lungs during the procedure.  A thin, flexible tube (catheter) will be placed into your bladder to drain urine.  A tube may be inserted through your nose and into your stomach (nasogastric tube, or NG tube). The tube is used to remove stomach  fluids after surgery until the intestines start working again.  An incision will be made in your abdomen.  Clamps or staples will be put on your colon.  The part of the colon between the clamps or staples will be removed.  The ends of the colon that remain will be stitched or stapled together.  The incision in your abdomen will be closed with stitches (sutures) or staples.  The incision will be covered with a bandage (dressing).  A small opening (stoma) may be created in your lower abdomen. A removable, external pouch (ostomy pouch) will be attached to the stoma. This pouch will collect stool outside of your body. Stool passes through the stoma and into the pouch instead of through your anus. The procedure may vary among health care providers and hospitals. What happens after the procedure?  Your blood pressure, heart rate, breathing rate, and blood oxygen level will be monitored until the medicines you were given have worn off.  You may continue to receive fluids and medicines through an IV tube.  You will start on a clear liquid diet and gradually go back to a normal diet.  Do not drive until your health care provider approves.  You may have some pain in your abdomen. You will be given pain medicine to control the pain.  You will be encouraged to do the following:  Do breathing exercises to prevent pneumonia.  Get up and start walking within a day after surgery. You should try to get up 5-6 times a day. This information is not intended to replace advice given to you by your health care provider. Make sure you discuss any questions you have with your health care provider. Document Released: 03/01/2009 Document Revised: 02/03/2016 Document Reviewed: 02/03/2016 Elsevier Interactive Patient Education  2017 [employer]

## 2016-06-30 NOTE — Progress Notes (Signed)
Patient ID: Tracy CarMary K Schnider, female   DOB: [birth year], 71 y.o.   MRN: [id number]  CC: Diverticulitis  HPI Tracy Haas is a 71 y.o. female who is known to the surgery service due to previous attacks of diverticulitis returns today to discuss her recurrent diverticulitis symptoms. Patient reports that since she was last seen in this office she's had 2-3 more attacks requiring antibiotic therapy. She underwent a colonoscopy which showed diverticulosis with a diverticular stricture of the sigmoid colon. She completed her antibiotic therapy approximately 2 weeks ago and currently states she is symptom free. However she does occasionally have a fullness feeling in her left lower quadrant prior to having bowel movements. She denies any current fevers, chills, nausea, vomiting, chest pain, shortness of breath. She is here today to discuss surgical intervention because she is tired of going through recurrent bouts of diverticulitis.  HPI  Past Medical History:  Diagnosis Date  . Adult hypothyroidism 11/05/2013  . Anxiety   . B12 deficiency 11/07/2013  . BP (high blood pressure) 02/11/2012  . Diverticulitis   . Fatty infiltration of liver 04/18/2012   Overview:  US finding 03/2012   . GERD (gastroesophageal reflux disease)   . Hematuria 01/22/2012  . High cholesterol   . HLD (hyperlipidemia) 10/14/2012  . Hypertension   . Hypothyroidism   . Panic attacks   . Stenosis of intestine     Past Surgical History:  Procedure Laterality Date  . ABDOMINAL HYSTERECTOMY  1989  . BREAST BIOPSY Right 1990's   neg  . COLONOSCOPY WITH PROPOFOL N/A 04/16/2015   Procedure: COLONOSCOPY WITH PROPOFOL;  Surgeon: Midge Miniumarren Wohl, MD;  Location: [facility] ENDOSCOPY;  Service: Endoscopy;  Laterality: N/A;  . SMALL INTESTINE SURGERY  1989   Dr. Evette CristalSankar- (After Hysterectomy)  . TRIGGER FINGER RELEASE Right 04/15/2016   Procedure: RELEASE TRIGGER FINGER/A-1 PULLEY;  Surgeon: Christena FlakeJohn J Poggi, MD;  [facility];   Service: Orthopedics;  Laterality: Right;    Family History  Problem Relation Age of Onset  . Hypertension Mother   . Cirrhosis Mother   . Hypertension Father   . Diabetes Father   . Kidney failure Father     Social History Social History  Substance Use Topics  . Smoking status: Never Smoker  . Smokeless tobacco: Never Used  . Alcohol use No    No Known Allergies  Current Outpatient Prescriptions  Medication Sig Dispense Refill  . bisoprolol-hydrochlorothiazide (ZIAC) 10-6.25 MG tablet Take 1 tablet by mouth daily.    . Calcium 500-125 MG-UNIT TABS Take by mouth.    . Cyanocobalamin (RA VITAMIN B-12 TR) 1000 MCG TBCR Take 1 tablet by mouth daily.    Marland Kitchen. levothyroxine (SYNTHROID, LEVOTHROID) 75 MCG tablet Take 1 tablet by mouth daily.    Marland Kitchen. omeprazole (PRILOSEC) 20 MG capsule Take 1 capsule by mouth daily.    Marland Kitchen. POTASSIUM CHLORIDE PO Take 20 mEq by mouth daily.    . ramipril (ALTACE) 2.5 MG capsule Take 2.5 mg by mouth daily.    . simvastatin (ZOCOR) 20 MG tablet Take 20 mg by mouth daily.    . bisacodyl (DULCOLAX) 5 MG EC tablet Take 4 tablets (20 mg total) by mouth once. 4 tablet 0  . erythromycin base (E-MYCIN) 500 MG tablet Take 2 tablets (1,000 mg total) by mouth 3 (three) times daily. 6 tablet 0  . neomycin (MYCIFRADIN) 500 MG tablet Take 2 tablets (1,000 mg total) by mouth 3 (three) times daily. 6 tablet 0  .  polyethylene glycol powder (GLYCOLAX/MIRALAX) powder Take 255 g by mouth once. 255 g 0   No current facility-administered medications for this visit.      Review of Systems A Multi-point review of systems was asked and was negative except for the findings documented in the history of present illness  Physical Exam Blood pressure (!) 156/91, pulse 77, temperature 98.1 F (36.7 C), temperature source Oral, height 5' (1.524 m), weight 61.8 kg (136 lb 3.2 oz). CONSTITUTIONAL: No acute distress. EYES: Pupils are equal, round, and reactive to light, Sclera are  non-icteric. EARS, NOSE, MOUTH AND THROAT: The oropharynx is clear. The oral mucosa is pink and moist. Hearing is intact to voice. LYMPH NODES:  Lymph nodes in the neck are normal. RESPIRATORY:  Lungs are clear. There is normal respiratory effort, with equal breath sounds bilaterally, and without pathologic use of accessory muscles. CARDIOVASCULAR: Heart is regular without murmurs, gallops, or rubs. GI: The abdomen is soft, nontender, and nondistended. There are no palpable masses. There is no hepatosplenomegaly. There are normal bowel sounds in all quadrants. Well-healed lower abdominal incision sites GU: Rectal deferred.   MUSCULOSKELETAL: Normal muscle strength and tone. No cyanosis or edema.   SKIN: Turgor is good and there are no pathologic skin lesions or ulcers. NEUROLOGIC: Motor and sensation is grossly normal. Cranial nerves are grossly intact. PSYCH:  Oriented to person, place and time. Affect is normal.  Data Reviewed Recent CT scan of the abdomen shows evidence of recurrent diverticulitis and abscess in the identical location as her previous bouts. There are no recent labs. I have personally reviewed the patient's imaging, laboratory findings and medical records.    Assessment    Recurrent sigmoid diverticulitis    Plan    71 year old female with recurrent sigmoid diverticulitis. Had a long conversation with the patient about the diagnosis as well as the treatment options. Given that she is continuing to have recurrent bouts of diverticulitis it was recommended that we plan for an elective sigmoid colectomy. Discussed that it would be ideal to wait for her to be approximately 6 weeks out from her most recent attack. Discussed that we would attempt to perform the surgery laparoscopically and the procedure was described in detail with laparoscopic and open approaches. Also discussed the importance of a bowel prep as well as for ureteral stent placements given her recent and recurrent  bouts with abscess to the lateral wall of the colon. Discussed the expected length of stay in the hospital as well as recovery. For both open and laparoscopic procedures. Patient voiced understanding that she is at a higher risk for this procedure given the recurrence of abscesses and desires to proceed. We will plan to proceed to surgery next month for a sigmoid colectomy with possible ileostomy creation should there be any question of anastomotic leak.     Time spent with the patient was 40 minutes, with more than 50% of the time spent in face-to-face education, counseling and care coordination.     Ricarda Frameharles Selma Mink, MD FACS General Surgeon 06/30/2016, 1:58 PM

## 2016-06-30 NOTE — Telephone Encounter (Signed)
Laparoscopic, Possibly Open Sigmoid Colectomy with Bilateral Uretral Sent Placement- 07/24/16- Dr. Tonita CongWoodham and Dr. Excell Seltzerooper assisting.  Stent [facility] be [facility] by [facility]- Call has been made to surgery scheduler to get this arranged. Awaiting a call back.

## 2016-07-01 ENCOUNTER — Other Ambulatory Visit: Payer: Self-pay | Admitting: Radiology

## 2016-07-01 NOTE — Telephone Encounter (Signed)
Patient has been called to advise of Surgery Date as well as Pre-Admission appointment date, time, and location. No answer. Left voicemail for return phone call.   Surgery Date: 07/24/16  Pre-admit Appointment: 07/16/16 at 1000am- Office  Patient has been advised to call [phone] the day before surgery between 1-3pm to obtain arrival time.

## 2016-07-01 NOTE — Telephone Encounter (Signed)
Spoke with Amy at [facility]. Dr. Sherryl BartersBudzyn will be doing stents for patient's case.

## 2016-07-01 NOTE — Telephone Encounter (Signed)
No authorization needed for CPT codes [id number] for inpatient surgery per Ruffin FrederickAnn M. Reference #-[id number] Dr.Budzyn CPT code [id number] no authorization required.

## 2016-07-01 NOTE — Telephone Encounter (Signed)
Patient is returning your phone call. I have reviewed the information documented below. Patient understands and has no questions.

## 2016-07-16 ENCOUNTER — Ambulatory Visit
Admission: RE | Admit: 2016-07-16 | Discharge: 2016-07-16 | Disposition: A | Discharge status: Home / Self Care | Payer: Medicare Other | Source: Ambulatory Visit | Attending: General Surgery | Admitting: General Surgery

## 2016-07-16 ENCOUNTER — Encounter
Admission: RE | Admit: 2016-07-16 | Discharge: 2016-07-16 | Disposition: A | Discharge status: Home / Self Care | Payer: Medicare Other | Source: Ambulatory Visit | Attending: General Surgery | Admitting: General Surgery

## 2016-07-16 DIAGNOSIS — R9431 Abnormal electrocardiogram [ECG] [EKG]: Secondary | ICD-10-CM | POA: Diagnosis not present

## 2016-07-16 DIAGNOSIS — Z01818 Encounter for other preprocedural examination: Secondary | ICD-10-CM | POA: Diagnosis not present

## 2016-07-16 DIAGNOSIS — Z01810 Encounter for preprocedural cardiovascular examination: Secondary | ICD-10-CM

## 2016-07-16 DIAGNOSIS — I10 Essential (primary) hypertension: Secondary | ICD-10-CM | POA: Diagnosis not present

## 2016-07-16 DIAGNOSIS — I517 Cardiomegaly: Secondary | ICD-10-CM | POA: Insufficient documentation

## 2016-07-16 DIAGNOSIS — K56699 Other intestinal obstruction unspecified as to partial versus complete obstruction: Secondary | ICD-10-CM | POA: Insufficient documentation

## 2016-07-16 DIAGNOSIS — K760 Fatty (change of) liver, not elsewhere classified: Secondary | ICD-10-CM | POA: Insufficient documentation

## 2016-07-16 DIAGNOSIS — R319 Hematuria, unspecified: Secondary | ICD-10-CM | POA: Insufficient documentation

## 2016-07-16 DIAGNOSIS — J9811 Atelectasis: Secondary | ICD-10-CM | POA: Diagnosis not present

## 2016-07-16 DIAGNOSIS — E785 Hyperlipidemia, unspecified: Secondary | ICD-10-CM | POA: Insufficient documentation

## 2016-07-16 DIAGNOSIS — Z01812 Encounter for preprocedural laboratory examination: Secondary | ICD-10-CM | POA: Insufficient documentation

## 2016-07-16 DIAGNOSIS — K5720 Diverticulitis of large intestine with perforation and abscess without bleeding: Secondary | ICD-10-CM | POA: Diagnosis not present

## 2016-07-16 HISTORY — DX: Family history of other specified conditions: Z84.89

## 2016-07-16 LAB — CBC WITH DIFFERENTIAL/PLATELET
BASOS ABS: 0 10*3/uL (ref 0–0.1)
Basophils Relative: 1 %
Eosinophils Absolute: 0.1 10*3/uL (ref 0–0.7)
Eosinophils Relative: 2 %
HEMATOCRIT: 35.1 % (ref 35.0–47.0)
HEMOGLOBIN: 12.1 g/dL (ref 12.0–16.0)
LYMPHS ABS: 0.7 10*3/uL — AB (ref 1.0–3.6)
LYMPHS PCT: 13 %
MCH: 31.6 pg (ref 26.0–34.0)
MCHC: 34.6 g/dL (ref 32.0–36.0)
MCV: 91.2 fL (ref 80.0–100.0)
Monocytes Absolute: 0.5 10*3/uL (ref 0.2–0.9)
Monocytes Relative: 9 %
NEUTROS PCT: 75 %
Neutro Abs: 3.7 10*3/uL (ref 1.4–6.5)
Platelets: 203 10*3/uL (ref 150–440)
RBC: 3.85 MIL/uL (ref 3.80–5.20)
RDW: 14.6 % — ABNORMAL HIGH (ref 11.5–14.5)
WBC: 5 10*3/uL (ref 3.6–11.0)

## 2016-07-16 LAB — COMPREHENSIVE METABOLIC PANEL
ALK PHOS: 57 U/L (ref 38–126)
ALT: 19 U/L (ref 14–54)
AST: 29 U/L (ref 15–41)
Albumin: 3.7 g/dL (ref 3.5–5.0)
Anion gap: 9 (ref 5–15)
BUN: 10 mg/dL (ref 6–20)
CHLORIDE: 109 mmol/L (ref 101–111)
CO2: 26 mmol/L (ref 22–32)
Calcium: 8.7 mg/dL — ABNORMAL LOW (ref 8.9–10.3)
Creatinine, Ser: 0.77 mg/dL (ref 0.44–1.00)
GLUCOSE: 115 mg/dL — AB (ref 65–99)
POTASSIUM: 3.3 mmol/L — AB (ref 3.5–5.1)
Sodium: 144 mmol/L (ref 135–145)
TOTAL PROTEIN: 7.2 g/dL (ref 6.5–8.1)
Total Bilirubin: 1.2 mg/dL (ref 0.3–1.2)

## 2016-07-16 LAB — SURGICAL PCR SCREEN
MRSA, PCR: NEGATIVE
Staphylococcus aureus: NEGATIVE

## 2016-07-16 NOTE — Pre-Procedure Instructions (Signed)
Today's potassium level was low at 3.3.  Met C results faxed to Dr. Woodham's office with request to increase potassium supplement. Potassium level will be rechecked via I-STAT day of surgery. 

## 2016-07-16 NOTE — Patient Instructions (Signed)
  Your procedure is scheduled YN:WGNFAOon:Friday March 9 , 2018. Report to Same Day Surgery. To find out your arrival time please call [phone] between 1PM - 3PM on Thursday July 23, 2016.  Remember: Instructions that are not followed completely may result in serious medical risk, up to and including death, or upon the discretion of your surgeon and anesthesiologist your surgery may need to be rescheduled.    _x___ 1. Do not eat food or drink liquids after midnight. No gum chewing or hard candies.     ____ 2. No Alcohol for 24 hours before or after surgery.   ____ 3. Bring all medications with you on the day of surgery if instructed.    __x__ 4. Notify your doctor if there is any change in your medical condition     (cold, fever, infections).    _____ 5. No smoking 24 hours prior to surgery.     Do not wear jewelry, make-up, hairpins, clips or nail polish.  Do not wear lotions, powders, or perfumes.   Do not shave 48 hours prior to surgery. Men may shave face and neck.  Do not bring valuables to the hospital.    [facility] is not responsible for any belongings or valuables.               Contacts, dentures or bridgework may not be worn into surgery.  Leave your suitcase in the car. After surgery it may be brought to your room.  For patients admitted to the hospital, discharge time is determined by your treatment team.   Patients discharged the day of surgery will not be allowed to drive home.    Please read over the following fact sheets that you were given:   [facility] Preparing for [facility]  __x__ Take these medicines the morning of surgery with A SIP OF WATER:    1. levothyroxine (SYNTHROID, LEVOTHROID)   2. omeprazole (PRILOSEC)  3. ramipril (ALTACE)  4. simvastatin (ZOCOR)  ____ Fleet Enema (as directed)   _x___ Use CHG Soap as directed on instruction sheet  ____ Use inhalers on the day of surgery and bring to hospital day of surgery  ____ Stop metformin 2 days prior  to surgery    ____ Take 1/2 of usual insulin dose the night before surgery and none on the morning of surgery.   ____ Stop Coumadin/Plavix/aspirin on does not apply.  _x___ Stop Anti-inflammatories such as Advil, Aleve, Ibuprofen, Motrin, Naproxen, Naprosyn, Goodies powders or aspirin products. OK to take Tylenol.   ____ Stop supplements until after surgery.    ____ Bring C-Pap to the hospital.

## 2016-07-17 ENCOUNTER — Telehealth: Payer: Self-pay

## 2016-07-17 LAB — HEMOGLOBIN A1C
HEMOGLOBIN A1C: 5.9 % — AB (ref 4.8–5.6)
Mean Plasma Glucose: 123 mg/dL

## 2016-07-17 MED ORDER — POTASSIUM CHLORIDE CRYS ER 20 MEQ PO TBCR: 20.0000 meq | EXTENDED_RELEASE_TABLET | Freq: Two times a day (BID) | ORAL | 0 refills | Status: DC

## 2016-07-17 NOTE — Telephone Encounter (Signed)
Patient returned phone call at this time. She verbalizes understanding about her Potassium and will begin this on 07/19/16 as informed to do.

## 2016-07-17 NOTE — Telephone Encounter (Signed)
Received notification that Pre-op Potassium is 3.3  Orders placed to recheck morning of surgery.  Per Protocol, patient has been placed on K-Dur PO BID x 5 days. Patient will take this 3/4-3/8. Her surgery is scheduled on 3/9. Medication sent to pharmacy.  Call made to patient at this time. No answer. All above information was left on patient's voicemail and I asked for patient to return my call to let me know that she received the message.

## 2016-07-23 MED ORDER — DEXTROSE 5 % IV SOLN
2.0000 g | INTRAVENOUS | Status: DC
  Filled 2016-07-23: qty 2

## 2016-07-24 ENCOUNTER — Inpatient Hospital Stay: Payer: Medicare Other

## 2016-07-24 ENCOUNTER — Inpatient Hospital Stay: Payer: Medicare Other | Admitting: Anesthesiology

## 2016-07-24 ENCOUNTER — Encounter
Admission: RE | Disposition: A | Discharge status: Home / Self Care | Payer: Self-pay | Source: Ambulatory Visit | Attending: General Surgery

## 2016-07-24 ENCOUNTER — Inpatient Hospital Stay
Admission: RE | Admit: 2016-07-24 | Discharge: 2016-07-28 | DRG: 331 | Disposition: A | Discharge status: Home / Self Care | Payer: Medicare Other | Source: Ambulatory Visit | Attending: General Surgery | Admitting: General Surgery

## 2016-07-24 DIAGNOSIS — E039 Hypothyroidism, unspecified: Secondary | ICD-10-CM | POA: Diagnosis present

## 2016-07-24 DIAGNOSIS — N359 Urethral stricture, unspecified: Secondary | ICD-10-CM | POA: Diagnosis present

## 2016-07-24 DIAGNOSIS — Z5331 Laparoscopic surgical procedure converted to open procedure: Secondary | ICD-10-CM | POA: Diagnosis not present

## 2016-07-24 DIAGNOSIS — N358 Other urethral stricture: Secondary | ICD-10-CM | POA: Diagnosis not present

## 2016-07-24 DIAGNOSIS — Z90710 Acquired absence of both cervix and uterus: Secondary | ICD-10-CM

## 2016-07-24 DIAGNOSIS — K5792 Diverticulitis of intestine, part unspecified, without perforation or abscess without bleeding: Secondary | ICD-10-CM | POA: Diagnosis present

## 2016-07-24 DIAGNOSIS — K219 Gastro-esophageal reflux disease without esophagitis: Secondary | ICD-10-CM | POA: Diagnosis present

## 2016-07-24 DIAGNOSIS — K5732 Diverticulitis of large intestine without perforation or abscess without bleeding: Principal | ICD-10-CM | POA: Diagnosis present

## 2016-07-24 DIAGNOSIS — K660 Peritoneal adhesions (postprocedural) (postinfection): Secondary | ICD-10-CM | POA: Diagnosis present

## 2016-07-24 DIAGNOSIS — E785 Hyperlipidemia, unspecified: Secondary | ICD-10-CM | POA: Diagnosis present

## 2016-07-24 DIAGNOSIS — K5720 Diverticulitis of large intestine with perforation and abscess without bleeding: Secondary | ICD-10-CM | POA: Diagnosis not present

## 2016-07-24 HISTORY — PX: COLON RESECTION SIGMOID: SHX6737

## 2016-07-24 HISTORY — PX: LAPAROSCOPIC SIGMOID COLECTOMY: SHX5928

## 2016-07-24 HISTORY — PX: CYSTOSCOPY WITH STENT PLACEMENT: SHX5790

## 2016-07-24 LAB — CREATININE, SERUM
Creatinine, Ser: 0.94 mg/dL (ref 0.44–1.00)
GFR calc Af Amer: >60 mL/min (ref >60)
GFR calc non Af Amer: 60 mL/min — ABNORMAL LOW (ref >60)

## 2016-07-24 LAB — POCT I-STAT 4, (NA,K, GLUC, HGB,HCT)
Glucose, Bld: 148 mg/dL — ABNORMAL HIGH (ref 65–99)
HCT: 42 % (ref 36.0–46.0)
HEMOGLOBIN: 14.3 g/dL (ref 12.0–15.0)
Potassium: 3.5 mmol/L (ref 3.5–5.1)
Sodium: 136 mmol/L (ref 135–145)

## 2016-07-24 LAB — CBC
HCT: 35.4 % (ref 35.0–47.0)
Hemoglobin: 12 g/dL (ref 12.0–16.0)
MCH: 30.8 pg (ref 26.0–34.0)
MCHC: 33.8 g/dL (ref 32.0–36.0)
MCV: 91.2 fL (ref 80.0–100.0)
PLATELETS: 181 10*3/uL (ref 150–440)
RBC: 3.88 MIL/uL (ref 3.80–5.20)
RDW: 14.3 % (ref 11.5–14.5)
WBC: 12.6 10*3/uL — ABNORMAL HIGH (ref 3.6–11.0)

## 2016-07-24 SURGERY — COLECTOMY, SIGMOID, LAPAROSCOPIC
Anesthesia: General | Wound class: Clean Contaminated

## 2016-07-24 MED ORDER — BISOPROLOL FUMARATE 5 MG PO TABS
5.0000 mg | ORAL_TABLET | Freq: Once | ORAL | Status: AC
Start: 2016-07-24 — End: 2016-07-24
  Administered 2016-07-24: 5 mg via ORAL
  Filled 2016-07-24: qty 1

## 2016-07-24 MED ORDER — KETOROLAC TROMETHAMINE 15 MG/ML IJ SOLN
15.0000 mg | Freq: Four times a day (QID) | INTRAMUSCULAR | Status: DC | PRN
  Administered 2016-07-24 – 2016-07-26 (×5): 15 mg via INTRAVENOUS
  Filled 2016-07-24 (×5): qty 1

## 2016-07-24 MED ORDER — OXYCODONE HCL 5 MG/5ML PO SOLN: 5.0000 mg | Freq: Once | ORAL | Status: DC | PRN

## 2016-07-24 MED ORDER — SUGAMMADEX SODIUM 200 MG/2ML IV SOLN
INTRAVENOUS | Status: DC | PRN
  Administered 2016-07-24: 150 mg via INTRAVENOUS

## 2016-07-24 MED ORDER — ONDANSETRON HCL 4 MG/2ML IJ SOLN: 4.0000 mg | Freq: Four times a day (QID) | INTRAMUSCULAR | Status: DC | PRN

## 2016-07-24 MED ORDER — SUCCINYLCHOLINE CHLORIDE 20 MG/ML IJ SOLN
INTRAMUSCULAR | Status: AC
  Filled 2016-07-24: qty 1

## 2016-07-24 MED ORDER — ALVIMOPAN 12 MG PO CAPS
ORAL_CAPSULE | ORAL | Status: AC
  Administered 2016-07-24: 12 mg via ORAL
  Filled 2016-07-24: qty 1

## 2016-07-24 MED ORDER — ONDANSETRON HCL 4 MG PO TABS: 4.0000 mg | ORAL_TABLET | Freq: Four times a day (QID) | ORAL | Status: DC | PRN

## 2016-07-24 MED ORDER — ONDANSETRON HCL 4 MG/2ML IJ SOLN
INTRAMUSCULAR | Status: AC
  Filled 2016-07-24: qty 2

## 2016-07-24 MED ORDER — DIPHENHYDRAMINE HCL 50 MG/ML IJ SOLN: 12.5000 mg | Freq: Four times a day (QID) | INTRAMUSCULAR | Status: DC | PRN

## 2016-07-24 MED ORDER — ACETAMINOPHEN 500 MG PO TABS
1000.0000 mg | ORAL_TABLET | Freq: Four times a day (QID) | ORAL | Status: AC
  Administered 2016-07-24 – 2016-07-25 (×2): 1000 mg via ORAL
  Filled 2016-07-24 (×4): qty 2

## 2016-07-24 MED ORDER — ONDANSETRON HCL 4 MG/2ML IJ SOLN
INTRAMUSCULAR | Status: DC | PRN
Start: 2016-07-24 — End: 2016-07-24
  Administered 2016-07-24: 4 mg via INTRAVENOUS

## 2016-07-24 MED ORDER — ACETAMINOPHEN 10 MG/ML IV SOLN
INTRAVENOUS | Status: AC
  Filled 2016-07-24: qty 100

## 2016-07-24 MED ORDER — CHLORHEXIDINE GLUCONATE CLOTH 2 % EX PADS: 6.0000 | MEDICATED_PAD | Freq: Once | CUTANEOUS | Status: DC

## 2016-07-24 MED ORDER — PROPOFOL 10 MG/ML IV BOLUS
INTRAVENOUS | Status: DC | PRN
  Administered 2016-07-24: 110 mg via INTRAVENOUS

## 2016-07-24 MED ORDER — LIDOCAINE HCL (PF) 1 % IJ SOLN
INTRAMUSCULAR | Status: DC | PRN
  Administered 2016-07-24: 4.5 mL

## 2016-07-24 MED ORDER — FENTANYL CITRATE (PF) 100 MCG/2ML IJ SOLN
25.0000 ug | INTRAMUSCULAR | Status: DC | PRN
Start: 2016-07-24 — End: 2016-07-24
  Administered 2016-07-24 (×2): 50 ug via INTRAVENOUS

## 2016-07-24 MED ORDER — ROCURONIUM BROMIDE 50 MG/5ML IV SOLN
INTRAVENOUS | Status: AC
  Filled 2016-07-24: qty 1

## 2016-07-24 MED ORDER — CEFOTETAN DISODIUM 2 G IJ SOLR
INTRAMUSCULAR | Status: DC | PRN
  Administered 2016-07-24: 2 g via INTRAVENOUS

## 2016-07-24 MED ORDER — PROPOFOL 10 MG/ML IV BOLUS
INTRAVENOUS | Status: AC
  Filled 2016-07-24: qty 20

## 2016-07-24 MED ORDER — MIDAZOLAM HCL 2 MG/2ML IJ SOLN
INTRAMUSCULAR | Status: DC | PRN
  Administered 2016-07-24: 1 mg via INTRAVENOUS

## 2016-07-24 MED ORDER — MIDAZOLAM HCL 2 MG/2ML IJ SOLN
INTRAMUSCULAR | Status: AC
  Filled 2016-07-24: qty 2

## 2016-07-24 MED ORDER — FENTANYL CITRATE (PF) 100 MCG/2ML IJ SOLN
INTRAMUSCULAR | Status: AC
  Filled 2016-07-24: qty 2

## 2016-07-24 MED ORDER — ACETAMINOPHEN 10 MG/ML IV SOLN
INTRAVENOUS | Status: DC | PRN
  Administered 2016-07-24: 1000 mg via INTRAVENOUS

## 2016-07-24 MED ORDER — BUPIVACAINE-EPINEPHRINE (PF) 0.25% -1:200000 IJ SOLN
INTRAMUSCULAR | Status: AC
  Filled 2016-07-24: qty 30

## 2016-07-24 MED ORDER — SEVOFLURANE IN SOLN
RESPIRATORY_TRACT | Status: AC
  Filled 2016-07-24: qty 250

## 2016-07-24 MED ORDER — LIDOCAINE HCL (CARDIAC) 20 MG/ML IV SOLN
INTRAVENOUS | Status: DC | PRN
  Administered 2016-07-24: 60 mg via INTRAVENOUS

## 2016-07-24 MED ORDER — BUPIVACAINE HCL (PF) 0.5 % IJ SOLN
INTRAMUSCULAR | Status: DC | PRN
  Administered 2016-07-24: 4.5 mL

## 2016-07-24 MED ORDER — LACTATED RINGERS IV SOLN
INTRAVENOUS | Status: DC
  Administered 2016-07-24 (×4): via INTRAVENOUS

## 2016-07-24 MED ORDER — MORPHINE SULFATE (PF) 4 MG/ML IV SOLN: 4.0000 mg | INTRAVENOUS | Status: DC | PRN

## 2016-07-24 MED ORDER — KCL IN DEXTROSE-NACL 20-5-0.45 MEQ/L-%-% IV SOLN
INTRAVENOUS | Status: DC
  Administered 2016-07-24 – 2016-07-27 (×8): via INTRAVENOUS
  Filled 2016-07-24 (×10): qty 1000

## 2016-07-24 MED ORDER — ALVIMOPAN 12 MG PO CAPS
12.0000 mg | ORAL_CAPSULE | Freq: Once | ORAL | Status: AC
  Administered 2016-07-24: 12 mg via ORAL

## 2016-07-24 MED ORDER — ENOXAPARIN SODIUM 40 MG/0.4ML ~~LOC~~ SOLN
40.0000 mg | SUBCUTANEOUS | Status: DC
  Administered 2016-07-25 – 2016-07-27 (×3): 40 mg via SUBCUTANEOUS
  Filled 2016-07-24 (×2): qty 0.4

## 2016-07-24 MED ORDER — GLYCOPYRROLATE 0.2 MG/ML IJ SOLN
INTRAMUSCULAR | Status: DC | PRN
  Administered 2016-07-24: 0.2 mg via INTRAVENOUS

## 2016-07-24 MED ORDER — FENTANYL CITRATE (PF) 100 MCG/2ML IJ SOLN
INTRAMUSCULAR | Status: AC
  Administered 2016-07-24: 50 ug via INTRAVENOUS
  Filled 2016-07-24: qty 2

## 2016-07-24 MED ORDER — BUPIVACAINE HCL (PF) 0.5 % IJ SOLN
INTRAMUSCULAR | Status: AC
  Filled 2016-07-24: qty 30

## 2016-07-24 MED ORDER — DIPHENHYDRAMINE HCL 12.5 MG/5ML PO ELIX: 12.5000 mg | ORAL_SOLUTION | Freq: Four times a day (QID) | ORAL | Status: DC | PRN

## 2016-07-24 MED ORDER — EPHEDRINE SULFATE 50 MG/ML IJ SOLN
INTRAMUSCULAR | Status: DC | PRN
  Administered 2016-07-24: 5 mg via INTRAVENOUS
  Administered 2016-07-24 (×2): 10 mg via INTRAVENOUS

## 2016-07-24 MED ORDER — LIDOCAINE HCL (PF) 2 % IJ SOLN
INTRAMUSCULAR | Status: AC
  Filled 2016-07-24: qty 2

## 2016-07-24 MED ORDER — FENTANYL CITRATE (PF) 100 MCG/2ML IJ SOLN
INTRAMUSCULAR | Status: DC | PRN
  Administered 2016-07-24 (×4): 25 ug via INTRAVENOUS
  Administered 2016-07-24 (×2): 50 ug via INTRAVENOUS

## 2016-07-24 MED ORDER — ROCURONIUM BROMIDE 100 MG/10ML IV SOLN
INTRAVENOUS | Status: DC | PRN
  Administered 2016-07-24: 30 mg via INTRAVENOUS
  Administered 2016-07-24 (×4): 10 mg via INTRAVENOUS
  Administered 2016-07-24: 20 mg via INTRAVENOUS
  Administered 2016-07-24: 10 mg via INTRAVENOUS

## 2016-07-24 MED ORDER — DEXTROSE 5 % IV SOLN: INTRAVENOUS | Status: DC | PRN

## 2016-07-24 MED ORDER — OXYCODONE HCL 5 MG PO TABS: 5.0000 mg | ORAL_TABLET | Freq: Once | ORAL | Status: DC | PRN

## 2016-07-24 MED ORDER — LIDOCAINE HCL (PF) 1 % IJ SOLN
INTRAMUSCULAR | Status: AC
  Filled 2016-07-24: qty 30

## 2016-07-24 SURGICAL SUPPLY — 102 items
ADHESIVE MASTISOL STRL (MISCELLANEOUS) ×4 IMPLANT
BACTOSHIELD CHG 4% 4OZ (MISCELLANEOUS) ×2
BLADE SURG SZ10 CARB STEEL (BLADE) ×4 IMPLANT
CANISTER SUCT 1200ML W/VALVE (MISCELLANEOUS) ×4 IMPLANT
CATH TRAY 16F METER LATEX (MISCELLANEOUS) ×4 IMPLANT
CHLORAPREP W/TINT 26ML (MISCELLANEOUS) ×4 IMPLANT
CLIP TI LARGE 6 (CLIP) IMPLANT
CLIP TI MEDIUM 6 (CLIP) IMPLANT
CLOSURE WOUND 1/2 X4 (GAUZE/BANDAGES/DRESSINGS) ×4
COVER CLAMP SIL LG PBX B (MISCELLANEOUS) IMPLANT
DEFOGGER SCOPE WARMER CLEARIFY (MISCELLANEOUS) ×4 IMPLANT
DRAPE LAPAROTOMY 100X77 ABD (DRAPES) ×4 IMPLANT
DRAPE LEGGINS SURG 28X43 STRL (DRAPES) ×4 IMPLANT
DRSG OPSITE POSTOP 3X4 (GAUZE/BANDAGES/DRESSINGS) ×12 IMPLANT
DRSG OPSITE POSTOP 4X10 (GAUZE/BANDAGES/DRESSINGS) ×4 IMPLANT
DRSG OPSITE POSTOP 4X8 (GAUZE/BANDAGES/DRESSINGS) ×4 IMPLANT
DRSG TELFA 3X8 NADH (GAUZE/BANDAGES/DRESSINGS) ×4 IMPLANT
ELECT BLADE 6.5 EXT (BLADE) ×4 IMPLANT
ELECT CAUTERY BLADE 6.4 (BLADE) ×4 IMPLANT
ELECT REM PT RETURN 9FT ADLT (ELECTROSURGICAL) ×4
ELECTRODE REM PT RTRN 9FT ADLT (ELECTROSURGICAL) ×2 IMPLANT
GAUZE SPONGE 4X4 12PLY STRL (GAUZE/BANDAGES/DRESSINGS) ×4 IMPLANT
GLOVE BIO SURGEON STRL SZ7.5 (GLOVE) ×20 IMPLANT
GLOVE BIO SURGEON STRL SZ8 (GLOVE) ×48 IMPLANT
GLOVE INDICATOR 8.0 STRL GRN (GLOVE) ×16 IMPLANT
GOWN STRL REUS W/ TWL LRG LVL3 (GOWN DISPOSABLE) ×12 IMPLANT
GOWN STRL REUS W/ TWL LRG LVL4 (GOWN DISPOSABLE) ×2 IMPLANT
GOWN STRL REUS W/ TWL XL LVL3 (GOWN DISPOSABLE) ×16 IMPLANT
GOWN STRL REUS W/TWL LRG LVL3 (GOWN DISPOSABLE) ×12
GOWN STRL REUS W/TWL LRG LVL4 (GOWN DISPOSABLE) ×2
GOWN STRL REUS W/TWL XL LVL3 (GOWN DISPOSABLE) ×20 IMPLANT
HANDLE YANKAUER SUCT BULB TIP (MISCELLANEOUS) ×4 IMPLANT
IRRIGATION STRYKERFLOW (MISCELLANEOUS) ×2 IMPLANT
IRRIGATOR STRYKERFLOW (MISCELLANEOUS) ×4
IV NS 1000ML (IV SOLUTION) ×2
IV NS 1000ML BAXH (IV SOLUTION) ×2 IMPLANT
KIT RM TURNOVER CYSTO AR (KITS) ×4 IMPLANT
KIT RM TURNOVER STRD PROC AR (KITS) ×4 IMPLANT
KIT URETTERAL LIGHTED STENTS (STENTS) ×4 IMPLANT
LABEL OR SOLS (LABEL) ×4 IMPLANT
LIGASURE BLUNT 5MM 37CM (INSTRUMENTS) ×4 IMPLANT
LIGASURE IMPACT 36 18CM CVD LR (INSTRUMENTS) ×4 IMPLANT
LIGASURE VESSEL 5MM BLUNT TIP (ELECTROSURGICAL) ×4 IMPLANT
NDL SAFETY 22GX1.5 (NEEDLE) ×4 IMPLANT
NEEDLE HYPO 25X1 1.5 SAFETY (NEEDLE) ×4 IMPLANT
NEEDLE VERESS 14GA 120MM (NEEDLE) ×4 IMPLANT
NS IRRIG 1000ML POUR BTL (IV SOLUTION) ×4 IMPLANT
PACK BASIN MAJOR ARMC (MISCELLANEOUS) ×4 IMPLANT
PACK COLON CLEAN CLOSURE (MISCELLANEOUS) ×4 IMPLANT
PACK CYSTO AR (MISCELLANEOUS) ×4 IMPLANT
PACK LAP CHOLECYSTECTOMY (MISCELLANEOUS) ×4 IMPLANT
PAD PREP 24X41 OB/GYN DISP (PERSONAL CARE ITEMS) IMPLANT
PENCIL ELECTRO HAND CTR (MISCELLANEOUS) ×4 IMPLANT
RELOAD PROXIMATE 30MM BLUE (ENDOMECHANICALS) IMPLANT
RELOAD STAPLER LINEAR PROX 30 (STAPLE) IMPLANT
RETRACTOR WOUND ALXS 18CM MED (MISCELLANEOUS) ×2 IMPLANT
RTRCTR WOUND ALEXIS O 18CM MED (MISCELLANEOUS) ×4
SCISSORS METZENBAUM CVD 33 (INSTRUMENTS) IMPLANT
SCRUB CHG 4% DYNA-HEX 4OZ (MISCELLANEOUS) ×2 IMPLANT
SENSORWIRE 0.038 NOT ANGLED (WIRE) ×4
SEPRAFILM MEMBRANE 5X6 (MISCELLANEOUS) IMPLANT
SET CYSTO W/LG BORE CLAMP LF (SET/KITS/TRAYS/PACK) ×4 IMPLANT
SET YANKAUER POOLE SUCT (MISCELLANEOUS) ×4 IMPLANT
SLEEVE ENDOPATH XCEL 5M (ENDOMECHANICALS) ×16 IMPLANT
SOL .9 NS 3000ML IRR  AL (IV SOLUTION) ×2
SOL .9 NS 3000ML IRR UROMATIC (IV SOLUTION) ×2 IMPLANT
SOL PREP PVP 2OZ (MISCELLANEOUS) ×4
SOLUTION PREP PVP 2OZ (MISCELLANEOUS) ×2 IMPLANT
SPONGE LAP 18X18 5 PK (GAUZE/BANDAGES/DRESSINGS) ×4 IMPLANT
STAPLER CIRCULAR 29MM (STAPLE) ×4 IMPLANT
STAPLER GUN LINEAR PROX 60 (STAPLE) ×4 IMPLANT
STAPLER PROXIMATE 55 BLUE (STAPLE) ×4 IMPLANT
STAPLER PROXIMATE 75MM BLUE (STAPLE) IMPLANT
STAPLER RELOAD LINEAR PROX 30 (STAPLE)
STAPLER SKIN PROX 35W (STAPLE) ×4 IMPLANT
STENT URET 6FRX24 CONTOUR (STENTS) IMPLANT
STENT URET 6FRX26 CONTOUR (STENTS) IMPLANT
STRIP CLOSURE SKIN 1/2X4 (GAUZE/BANDAGES/DRESSINGS) ×12 IMPLANT
SURGILUBE 2OZ TUBE FLIPTOP (MISCELLANEOUS) ×8 IMPLANT
SUT MNCRL 3-0 UNDYED SH (SUTURE) ×4 IMPLANT
SUT MNCRL 4-0 (SUTURE) ×6
SUT MNCRL 4-0 27XMFL (SUTURE) ×6
SUT MONOCRYL 3-0 UNDYED (SUTURE) ×4
SUT PDS AB 1 CT1 27 (SUTURE) ×4 IMPLANT
SUT PDS AB 1 TP1 54 (SUTURE) ×12 IMPLANT
SUT SILK 0 (SUTURE) ×4
SUT SILK 0 30XBRD TIE 6 (SUTURE) ×4 IMPLANT
SUT SILK 3-0 (SUTURE) ×22 IMPLANT
SUT SILK 3-0 SH-1 18XCR BRD (SUTURE) ×2
SUT VIC AB 1 CTX 27 (SUTURE) ×12 IMPLANT
SUT VIC AB 2-0 CT1 27 (SUTURE) ×4
SUT VIC AB 2-0 CT1 TAPERPNT 27 (SUTURE) ×4 IMPLANT
SUT VICRYL 0 AB UR-6 (SUTURE) ×4 IMPLANT
SUT VICRYL 0 TIES 12 18 (SUTURE) ×8 IMPLANT
SUTURE MNCRL 4-0 27XMF (SUTURE) ×6 IMPLANT
SUTURE SILK 3-0 SH-1 18XCR BRD (SUTURE) ×2 IMPLANT
SYRINGE 10CC LL (SYRINGE) ×4 IMPLANT
TROCAR XCEL 12X100 BLDLESS (ENDOMECHANICALS) ×4 IMPLANT
TROCAR XCEL NON-BLD 5MMX100MML (ENDOMECHANICALS) ×4 IMPLANT
TUBING INSUFFLATOR HEATED (MISCELLANEOUS) ×4 IMPLANT
WATER STERILE IRR 1000ML POUR (IV SOLUTION) ×4 IMPLANT
WIRE SENSOR 0.038 NOT ANGLED (WIRE) ×2 IMPLANT

## 2016-07-24 NOTE — Transfer of Care (Signed)
Immediate Anesthesia Transfer of Care Note  Patient: Tracy Haas  Procedure(s) Performed: Procedure(s): LAPAROSCOPIC SIGMOID COLECTOMY (N/A) COLON RESECTION SIGMOID (N/A) CYSTOSCOPY WITH STENT PLACEMENT (Bilateral)  Patient Location: PACU  Anesthesia Type:General  Level of Consciousness: awake  Airway & Oxygen Therapy: Patient Spontanous Breathing and Patient connected to face mask oxygen  Post-op Assessment: Report given to RN and Post -op Vital signs reviewed and stable  Post vital signs: Reviewed and stable  Last Vitals:  Vitals:   07/24/16 0726 07/24/16 1202  BP:  133/84  Pulse:  69  Resp:  16  Temp: 36.6 C (!) 36.1 C    Last Pain:  Vitals:   07/24/16 0726  TempSrc: Tympanic         Complications: No apparent anesthesia complications

## 2016-07-24 NOTE — OR Nursing (Signed)
OR will start antibiotic per B Arlice ColtMorgan rn

## 2016-07-24 NOTE — Anesthesia Procedure Notes (Signed)
Procedure Name: Intubation Date/Time: 07/24/2016 8:00 AM Performed by: Allean Found Pre-anesthesia Checklist: Patient identified, Emergency Drugs available, Suction available, Patient being monitored and Timeout performed Patient Re-evaluated:Patient Re-evaluated prior to inductionOxygen Delivery Method: Circle system utilized Preoxygenation: Pre-oxygenation with 100% oxygen Intubation Type: IV induction Ventilation: Mask ventilation without difficulty Laryngoscope Size: Miller and 2 Grade View: Grade II Tube type: Oral Tube size: 7.0 mm Number of attempts: 2 (one by myself, one by Dr Mamie Nick) Airway Equipment and Method: Stylet Placement Confirmation: ETT inserted through vocal cords under direct vision,  positive ETCO2 and breath sounds checked- equal and bilateral Secured at: 21 cm Tube secured with: Tape Dental Injury: Teeth and Oropharynx as per pre-operative assessment  Difficulty Due To: Difficulty was unanticipated and Difficult Airway- due to anterior larynx Comments: 1st attempt Mac #3 blade, pt anterior, passed to Dr Mamie Nick.  Success with Miller 2 stylet.

## 2016-07-24 NOTE — Interval H&P Note (Signed)
History and Physical Interval Note:  07/24/2016 7:17 AM  Tracy Haas  has presented today for surgery, with the diagnosis of diverticulitis  The various methods of treatment have been discussed with the patient and family. After consideration of risks, benefits and other options for treatment, the patient has consented to  Procedure(s): LAPAROSCOPIC SIGMOID COLECTOMY (N/A) COLON RESECTION SIGMOID (N/A) CYSTOSCOPY WITH STENT PLACEMENT (Bilateral) as a surgical intervention .  The patient's history has been reviewed, patient examined, no change in status, stable for surgery.  I have reviewed the patient's chart and labs.  Questions were answered to the patient's satisfaction.     Ricarda Frameharles Emarie Paul

## 2016-07-24 NOTE — Anesthesia Post-op Follow-up Note (Cosign Needed)
Anesthesia QCDR form completed.        

## 2016-07-24 NOTE — Op Note (Signed)
Pre-operative Diagnosis: Diverticulitis  Post-operative Diagnosis: Same  Procedure performed laparoscopic converted to open sigmoid colectomy  Surgeon: Tracy Frame, Dionne Milo    Assistants: None  Anesthesia: General endotracheal anesthesia  ASA Class: 2  Surgeon: Tracy Frame, MD FACS  Anesthesia: Gen. with endotracheal tube  Assistant:None  Procedure Details  The patient was seen again in the Holding Room. The benefits, complications, treatment options, and expected outcomes were discussed with the patient. The risks of bleeding, infection, recurrence of symptoms, failure to resolve symptoms,  bowel injury, any of which could require further surgery were reviewed with the patient.   The patient was taken to Operating Room, identified as Tracy Haas and the procedure verified.  A Time Out was held and the above information confirmed.  Prior to the induction of general anesthesia, antibiotic prophylaxis was administered. VTE prophylaxis was in place. General endotracheal anesthesia was then administered and tolerated well. After the induction, the abdomen was prepped with Chloraprep and draped in the sterile fashion. The patient was positioned in the low lithotomy position.  Procedure began laparoscopically with a right upper quadrant Veress needle approach. The skin 2 fingers below the costal margin in the midclavicular and was localized with a 50-50 fissure 1% lidocaine 0.5% Marcaine plain. It was incised with a scalpel and using a Veress needle and pneumoperitoneum was established at 15 mmHg. A 5 mm Optiview trocar was then placed and the abdomen visualized. Abdominal wall and entry into the peritoneal cavity. There is no evidence of damage to the deep returning structures from either the Veress needle or the Optiview trocar. Patient was noted to have extensive lower midline adhesions however an additional right lower quadrant trocar is able to be placed under direct  visualization without difficulty. A 12 mm trocar was placed here. Using blunt dissection the midline adhesions were taken down until an infraumbilical 5 mm trocar could be placed under direct visualization.  We continued with our easy lysis until all the abdominal contents were free from its attachments the anterior abdominal wall. Patient was placed head down and additional left upper quadrant 5 mm trocar was placed for camera. Patient was noted to have a dense inflammatory adhesion to the lateral sidewall from the sigmoid colon. The remaining colon was also noted to have multiple adhesions to small bowel. These were taken down using comminution blunt dissection and sharp dissection with the LigaSure device. We proceeded with lysis of adhesion for nearly 2 hours with the last hour having stalled progress. Due to the inability to make progress in the disease process the decision was made to convert to an open procedure.  The infraumbilical incision was taken down with a 10 blade scalpel the midline towards the pelvis. Then using electrocautery the remaining tissues were taken down until the entry to the peritoneum was created. The deep structures were protected manually while the peritoneal was opened with a left cautery. We were then able to palpate the previously visualized dense implant were adhesions. Using accommodation of blunt finger fracture and sharp dissection with Metzenbaum scissors we were able to free up the sigmoid colon from all its attachments. The adhesions to the small bowel were noted to be extensive even after the laparoscopic lysis. These were also taken down sharply with a small scissors. Once we have the sigmoid colon free and mobile using accommodation of sharp dissection, LigaSure device, which are cautery the mesentery was taken down to the pelvic reflection. The bowel was circumferentially cleared to allow  for the distal end to be stapled off with a 60 mm blue load TA stapler. We then  approximated our dissection of the mesentery until we were proximal of the area of inflammation and excised the specimen with a 55mm blue load GIA stapler. This freed the sigmoid colon and 11 be passed off as a specimen.  Any areas of bleeding were controlled using accommodation of electrocautery and suture ligation with 3-0 silk suture. The white line of Toldt was then taken down with electrocautery which allowed adequate mobilization of the sigmoid colon to reach the pelvis. The previously placed staple line was then opened sharply and using EEA sizers was found to accept a 29 EEA. The appropriately sized stapler was then opened and the anvil was secured within the colon with a 3-0 Prolene pursestring suture. The rectum was then manually dilated and then dilated with the EEA sizers until it accepted a 29 EEA. The EEA stapler was then able to be placed into the rectum and visualized and palpated going to the staple line of the distal remnant. The spike was extended under direct palpation and visualization and able to be married with the anvil with ease. Stapler was closed and the appropriate orientation and fired without difficulty. The stapler was retracted and 2 intact donuts were visualized. A leak test was then performed by instilling air within the rectum with the anastomosis under water without any evidence of air leak.  The previous 12 mm trocar sites were then closed internally with figure-of-eight 0 Vicryl sutures. There is no evidence of leakage within the abdomen or continued bleeding so the decision was then made to proceed with a clean closure with the entire operative team breaking scrub and re-scrubbing. With a new draping and clean utensils.  The lower midline incision was closed with a running #1 PDS suture. All of the incision sites were then closed with surgical staples without difficulty. A total of 4 honeycomb dressings were placed on the abdomen with the lower midline incision and the 3  laparoscopic trocar sites. After the dressings were in place the drapes were taken down and the bilateral ureter stents were removed without difficulty. The patient was awoken from general endotracheal anesthesia without complication and transferred to the PACU in good condition. There were no immediate, complications and all counts were correct at the end of the procedure.  Findings: Extensive inflammatory changes from sigmoid diverticulitis   Estimated Blood Loss: 200 mL         Drains: None         Specimens: Sigmoid colon          Complications: None                  Condition: Good   Tracy Frameharles Keen Ewalt, MD, FACS

## 2016-07-24 NOTE — Care Management (Signed)
Patient status post laparoscopic converted to open sigmoid colectomy/  PCP Hyacinth MeekerMiller.  Pharmacy [facility]. PT consult pending.  RNCM following for potential skilled RN needs at home.

## 2016-07-24 NOTE — H&P (View-Only) (Signed)
Patient ID: Tracy CarMary K Schnider, female   DOB: [birth year], 71 y.o.   MRN: [id number]  CC: Diverticulitis  HPI Tracy Haas is a 71 y.o. female who is known to the surgery service due to previous attacks of diverticulitis returns today to discuss her recurrent diverticulitis symptoms. Patient reports that since she was last seen in this office she's had 2-3 more attacks requiring antibiotic therapy. She underwent a colonoscopy which showed diverticulosis with a diverticular stricture of the sigmoid colon. She completed her antibiotic therapy approximately 2 weeks ago and currently states she is symptom free. However she does occasionally have a fullness feeling in her left lower quadrant prior to having bowel movements. She denies any current fevers, chills, nausea, vomiting, chest pain, shortness of breath. She is here today to discuss surgical intervention because she is tired of going through recurrent bouts of diverticulitis.  HPI  Past Medical History:  Diagnosis Date  . Adult hypothyroidism 11/05/2013  . Anxiety   . B12 deficiency 11/07/2013  . BP (high blood pressure) 02/11/2012  . Diverticulitis   . Fatty infiltration of liver 04/18/2012   Overview:  US finding 03/2012   . GERD (gastroesophageal reflux disease)   . Hematuria 01/22/2012  . High cholesterol   . HLD (hyperlipidemia) 10/14/2012  . Hypertension   . Hypothyroidism   . Panic attacks   . Stenosis of intestine     Past Surgical History:  Procedure Laterality Date  . ABDOMINAL HYSTERECTOMY  1989  . BREAST BIOPSY Right 1990's   neg  . COLONOSCOPY WITH PROPOFOL N/A 04/16/2015   Procedure: COLONOSCOPY WITH PROPOFOL;  Surgeon: Midge Miniumarren Wohl, MD;  Location: [facility] ENDOSCOPY;  Service: Endoscopy;  Laterality: N/A;  . SMALL INTESTINE SURGERY  1989   Dr. Evette CristalSankar- (After Hysterectomy)  . TRIGGER FINGER RELEASE Right 04/15/2016   Procedure: RELEASE TRIGGER FINGER/A-1 PULLEY;  Surgeon: Christena FlakeJohn J Poggi, MD;  [facility];   Service: Orthopedics;  Laterality: Right;    Family History  Problem Relation Age of Onset  . Hypertension Mother   . Cirrhosis Mother   . Hypertension Father   . Diabetes Father   . Kidney failure Father     Social History Social History  Substance Use Topics  . Smoking status: Never Smoker  . Smokeless tobacco: Never Used  . Alcohol use No    No Known Allergies  Current Outpatient Prescriptions  Medication Sig Dispense Refill  . bisoprolol-hydrochlorothiazide (ZIAC) 10-6.25 MG tablet Take 1 tablet by mouth daily.    . Calcium 500-125 MG-UNIT TABS Take by mouth.    . Cyanocobalamin (RA VITAMIN B-12 TR) 1000 MCG TBCR Take 1 tablet by mouth daily.    Marland Kitchen. levothyroxine (SYNTHROID, LEVOTHROID) 75 MCG tablet Take 1 tablet by mouth daily.    Marland Kitchen. omeprazole (PRILOSEC) 20 MG capsule Take 1 capsule by mouth daily.    Marland Kitchen. POTASSIUM CHLORIDE PO Take 20 mEq by mouth daily.    . ramipril (ALTACE) 2.5 MG capsule Take 2.5 mg by mouth daily.    . simvastatin (ZOCOR) 20 MG tablet Take 20 mg by mouth daily.    . bisacodyl (DULCOLAX) 5 MG EC tablet Take 4 tablets (20 mg total) by mouth once. 4 tablet 0  . erythromycin base (E-MYCIN) 500 MG tablet Take 2 tablets (1,000 mg total) by mouth 3 (three) times daily. 6 tablet 0  . neomycin (MYCIFRADIN) 500 MG tablet Take 2 tablets (1,000 mg total) by mouth 3 (three) times daily. 6 tablet 0  .  polyethylene glycol powder (GLYCOLAX/MIRALAX) powder Take 255 g by mouth once. 255 g 0   No current facility-administered medications for this visit.      Review of Systems A Multi-point review of systems was asked and was negative except for the findings documented in the history of present illness  Physical Exam Blood pressure (!) 156/91, pulse 77, temperature 98.1 F (36.7 C), temperature source Oral, height 5' (1.524 m), weight 61.8 kg (136 lb 3.2 oz). CONSTITUTIONAL: No acute distress. EYES: Pupils are equal, round, and reactive to light, Sclera are  non-icteric. EARS, NOSE, MOUTH AND THROAT: The oropharynx is clear. The oral mucosa is pink and moist. Hearing is intact to voice. LYMPH NODES:  Lymph nodes in the neck are normal. RESPIRATORY:  Lungs are clear. There is normal respiratory effort, with equal breath sounds bilaterally, and without pathologic use of accessory muscles. CARDIOVASCULAR: Heart is regular without murmurs, gallops, or rubs. GI: The abdomen is soft, nontender, and nondistended. There are no palpable masses. There is no hepatosplenomegaly. There are normal bowel sounds in all quadrants. Well-healed lower abdominal incision sites GU: Rectal deferred.   MUSCULOSKELETAL: Normal muscle strength and tone. No cyanosis or edema.   SKIN: Turgor is good and there are no pathologic skin lesions or ulcers. NEUROLOGIC: Motor and sensation is grossly normal. Cranial nerves are grossly intact. PSYCH:  Oriented to person, place and time. Affect is normal.  Data Reviewed Recent CT scan of the abdomen shows evidence of recurrent diverticulitis and abscess in the identical location as her previous bouts. There are no recent labs. I have personally reviewed the patient's imaging, laboratory findings and medical records.    Assessment    Recurrent sigmoid diverticulitis    Plan    71 year old female with recurrent sigmoid diverticulitis. Had a long conversation with the patient about the diagnosis as well as the treatment options. Given that she is continuing to have recurrent bouts of diverticulitis it was recommended that we plan for an elective sigmoid colectomy. Discussed that it would be ideal to wait for her to be approximately 6 weeks out from her most recent attack. Discussed that we would attempt to perform the surgery laparoscopically and the procedure was described in detail with laparoscopic and open approaches. Also discussed the importance of a bowel prep as well as for ureteral stent placements given her recent and recurrent  bouts with abscess to the lateral wall of the colon. Discussed the expected length of stay in the hospital as well as recovery. For both open and laparoscopic procedures. Patient voiced understanding that she is at a higher risk for this procedure given the recurrence of abscesses and desires to proceed. We will plan to proceed to surgery next month for a sigmoid colectomy with possible ileostomy creation should there be any question of anastomotic leak.     Time spent with the patient was 40 minutes, with more than 50% of the time spent in face-to-face education, counseling and care coordination.     Ricarda Frameharles Tyde Lamison, MD FACS General Surgeon 06/30/2016, 1:58 PM

## 2016-07-24 NOTE — Brief Op Note (Signed)
07/24/2016  11:51 AM  PATIENT:  Tracy Haas  71 y.o. female  PRE-OPERATIVE DIAGNOSIS:  diverticulitis  POST-OPERATIVE DIAGNOSIS:  diverticulitis  PROCEDURE:  Procedure(s): LAPAROSCOPIC SIGMOID COLECTOMY (N/A) COLON RESECTION SIGMOID (N/A) CYSTOSCOPY WITH STENT PLACEMENT (Bilateral)  SURGEON:  Surgeon(s) and Role: Panel 1:    * Ricarda Frameharles Morgen Linebaugh, MD - Primary    * Lattie Hawichard E Cooper, MD - Assisting  Panel 2:    * Hildred LaserBrian James Budzyn, MD - Primary  PHYSICIAN ASSISTANT:   ASSISTANTS: none   ANESTHESIA:   general  EBL:  Total I/O In: 2000 [I.V.:2000] Out: 275 [Urine:75; Blood:200]  BLOOD ADMINISTERED:none  DRAINS: none   LOCAL MEDICATIONS USED:  MARCAINE    and XYLOCAINE   SPECIMEN:  Source of Specimen:  sigmoid colon  DISPOSITION OF SPECIMEN:  PATHOLOGY  COUNTS:  YES  TOURNIQUET:  * No tourniquets in log *  DICTATION: .Dragon Dictation  PLAN OF CARE: Admit to inpatient   PATIENT DISPOSITION:  PACU - hemodynamically stable.   Delay start of Pharmacological VTE agent (>24hrs) due to surgical blood loss or risk of bleeding: no

## 2016-07-24 NOTE — Anesthesia Preprocedure Evaluation (Signed)
Anesthesia Evaluation  Patient identified by MRN, date of birth, ID band Patient awake    Reviewed: Allergy & Precautions, H&P , NPO status , Patient's Chart, lab work & pertinent test results  History of Anesthesia Complications (+) Family history of anesthesia reaction and history of anesthetic complications  Airway Mallampati: II  TM Distance: >3 FB Neck ROM: limited    Dental  (+) Poor Dentition, Chipped, Caps   Pulmonary neg pulmonary ROS, neg shortness of breath,    Pulmonary exam normal breath sounds clear to auscultation       Cardiovascular Exercise Tolerance: Good hypertension, (-) angina(-) Past MI and (-) DOE Normal cardiovascular exam Rhythm:regular Rate:Normal     Neuro/Psych PSYCHIATRIC DISORDERS Anxiety negative neurological ROS  negative psych ROS   GI/Hepatic Neg liver ROS, GERD  Controlled and Medicated,  Endo/Other  Hypothyroidism   Renal/GU      Musculoskeletal   Abdominal   Peds  Hematology negative hematology ROS (+)   Anesthesia Other Findings Past Medical History: 11/05/2013: Adult hypothyroidism No date: Anxiety 11/07/2013: B12 deficiency 02/11/2012: BP (high blood pressure) No date: Diverticulitis No date: Family history of adverse reaction to anesthes*     Comment: mother- possible problems with intubation. 04/18/2012: Fatty infiltration of liver     Comment: Overview:  US finding 03/2012  No date: GERD (gastroesophageal reflux disease) 01/22/2012: Hematuria No date: High cholesterol 10/14/2012: HLD (hyperlipidemia) No date: Hypertension No date: Hypothyroidism No date: Panic attacks No date: Stenosis of intestine  Past Surgical History: 1989: ABDOMINAL HYSTERECTOMY 1990's: BREAST BIOPSY Right     Comment: neg 04/16/2015: COLONOSCOPY WITH PROPOFOL N/A     Comment: Procedure: COLONOSCOPY WITH PROPOFOL;                Surgeon: Midge Miniumarren Wohl, MD;  Location: [facility]   ENDOSCOPY;  Service: Endoscopy;  Laterality:               N/A; 1989: SMALL INTESTINE SURGERY     Comment: Dr. Evette CristalSankar- (After Hysterectomy) 04/15/2016: TRIGGER FINGER RELEASE Right     Comment: Procedure: RELEASE TRIGGER FINGER/A-1 PULLEY;               Surgeon: Christena FlakeJohn J Poggi, MD;  Location: [facility];  Service: Orthopedics;                Laterality: Right;  BMI    Body Mass Index:  26.56 kg/m      Reproductive/Obstetrics negative OB ROS                             Anesthesia Physical Anesthesia Plan  ASA: III  Anesthesia Plan: General ETT   Post-op Pain Management:    Induction:   Airway Management Planned:   Additional Equipment:   Intra-op Plan:   Post-operative Plan:   Informed Consent: I have reviewed the patients History and Physical, chart, labs and discussed the procedure including the risks, benefits and alternatives for the proposed anesthesia with the patient or authorized representative who has indicated his/her understanding and acceptance.   Dental Advisory Given  Plan Discussed with: Anesthesiologist, CRNA and Surgeon  Anesthesia Plan Comments:         Anesthesia Quick Evaluation

## 2016-07-24 NOTE — Op Note (Signed)
Date of procedure: 07/24/16  Preoperative diagnosis:  1. Diverticulitis   Postoperative diagnosis:  1. Diverticulitis  2. Meatal stenosis  Procedure: 1. Cystoscopy 2. Bilateral lighted ureteral catheter placement 3. Meatal dilation  Surgeon: Baruch Gouty, MD  Anesthesia: General  Complications: None  Intraoperative findings: Under fluoroscopy, the bilateral ureteral catheters were noted to be in the correct position.   EBL: None  Specimens: None  Drains: Bilateral lighted open-ended ureteral catheters and 16 French Foley catheter  Disposition: Stable to the postanesthesia care unit  Indication for procedure: The patient is a 71 y.o. female with history of recurrent diverticulitis presents today for partial colectomy. At the request of the general surgeon, the patient has been consented for bilateral ureteral catheter placement to aid in the dissection of her colon.  After reviewing the management options for treatment, the patient elected to proceed with the above surgical procedure(s). We have discussed the potential benefits and risks of the procedure, side effects of the proposed treatment, the likelihood of the patient achieving the goals of the procedure, and any potential problems that might occur during the procedure or recuperation. Informed consent has been obtained.  Description of procedure: The patient was met in the preoperative area. All risks, benefits, and indications of the procedure were described in great detail. The patient consented to the procedure. Preoperative antibiotics were given. The patient was taken to the operative theater. General anesthesia was induced per the anesthesia service. The patient was then placed in the dorsal lithotomy position and prepped and draped in the usual sterile fashion. A preoperative timeout was called.   A 21 French 30 cystoscope was attempted to insert of the patient's bladder however she had meatal stenosis. Using female  urethral sounds urethra was dilated to 22 [place]. A 21 French 30 cystoscope was inserted into the patient's bladder per urethra atraumatically. The right ureter orifice was visualized and a sensor wire was placed to level of the renal pelvis on fluoroscopy. The [place] open-ended E catheter device was then advanced to the double knot [place]. The sensor wire was then removed. This process was then repeated on the left side. The cystoscope was then withdrawn a Foley catheter was placed. The lighted [place] devices were inserted into these open-ended catheters and secured in the usual fashion. These catheters were then secured with a 16 French Foley catheter. There OR was then transferred to general surgery for completion of their part of the procedure.  Plan:  After completion of the partial collecting, the ureteral catheters will be removed per the general surgery service.  Baruch Gouty, M.D.

## 2016-07-25 LAB — BASIC METABOLIC PANEL
Anion gap: 6 (ref 5–15)
BUN: 10 mg/dL (ref 6–20)
CO2: 24 mmol/L (ref 22–32)
Calcium: 7.7 mg/dL — ABNORMAL LOW (ref 8.9–10.3)
Chloride: 104 mmol/L (ref 101–111)
Creatinine, Ser: 0.71 mg/dL (ref 0.44–1.00)
GFR calc Af Amer: >60 mL/min (ref >60)
GFR calc non Af Amer: >60 mL/min (ref >60)
Glucose, Bld: 171 mg/dL — ABNORMAL HIGH (ref 65–99)
POTASSIUM: 3.6 mmol/L (ref 3.5–5.1)
Sodium: 134 mmol/L — ABNORMAL LOW (ref 135–145)

## 2016-07-25 LAB — CBC
HEMATOCRIT: 30.1 % — AB (ref 35.0–47.0)
Hemoglobin: 10.4 g/dL — ABNORMAL LOW (ref 12.0–16.0)
MCH: 31 pg (ref 26.0–34.0)
MCHC: 34.6 g/dL (ref 32.0–36.0)
MCV: 89.7 fL (ref 80.0–100.0)
Platelets: 160 10*3/uL (ref 150–440)
RBC: 3.35 MIL/uL — ABNORMAL LOW (ref 3.80–5.20)
RDW: 14 % (ref 11.5–14.5)
WBC: 7.4 10*3/uL (ref 3.6–11.0)

## 2016-07-25 NOTE — Evaluation (Signed)
Physical Therapy Evaluation Patient Details Name: Tracy Haas K Garriga MRN: [id number] DOB: 14-Sep-1945 Today's Date: 07/25/2016   History of Present Illness  [name] is a 71yo white person identifying as female who [facility] for [facility] surgery related to diverticulitis.  Clinical Impression  Pt admitted with above diagnosis. Pt currently with functional limitations due to the deficits listed below (see "PT Problem List"). Upon entry, the patient is received semirecumbent in bed, no family/caregiver present.   The pt is awake and agreeable to participate. No acute distress noted at this time, pt only reporting some 5/10, postoperative pain. Orthostatic vitals assess, VSS during eval. The pt is alert and oriented x3, pleasant, conversational, and following simple and multi-step commands consistently. Functional mobility assessment demonstrates mild-moderate impairment, mostly limited by pain, now requiring some physical assistance, whereas the patient performs these independently at baseline. Falls risk is mildly elevated, AEB gait speed, but otherwise now patent LOB in session. Pt will benefit from skilled PT intervention to increase independence and safety with basic mobility in preparation for discharge to the venue listed below.       Follow Up Recommendations No PT follow up    Equipment Recommendations  None recommended by PT    Recommendations for Other Services       Precautions / Restrictions Precautions Precautions: Fall      Mobility  Bed Mobility Overal bed mobility: Needs Assistance Bed Mobility: Supine to Sit     Supine to sit: Min assist     General bed mobility comments: trunk assist, RUE assist on bedrail, LU holding brace pillow at surgical site.   Transfers Overall transfer level: Modified independent Equipment used: None                Ambulation/Gait Ambulation/Gait assistance: Supervision Ambulation Distance (Feet): 200 Feet Assistive device:  None   Gait velocity: 0.5137m/s, far from baseline, reports self limitations imposed by pain.  Gait velocity interpretation: <1.8 ft/sec, indicative of risk for recurrent falls    Stairs            Wheelchair Mobility    Modified Rankin (Stroke Patients Only)       Balance Overall balance assessment: No apparent balance deficits (not formally assessed);Modified Independent (1 fall 71ya related to medication dosage error)                                           Pertinent Vitals/Pain Pain Assessment: 0-10 Pain Score: 5  Pain Location: surgical site, ABD Pain Intervention(s): Limited activity within patient's tolerance;Monitored during session    Home Living Family/patient expects to be discharged to:: Private residence Living Arrangements: Alone (widowed 4YA) Available Help at Discharge: Family (2 sons) Type of Home: House       Home Layout: Two level;Able to live on main level with bedroom/bathroom Home Equipment: None      Prior Function Level of Independence: Independent         Comments: fully independent community dweller     Hand Dominance        Extremity/Trunk Assessment   Upper Extremity Assessment Upper Extremity Assessment: Overall WFL for tasks assessed    Lower Extremity Assessment Lower Extremity Assessment: Overall WFL for tasks assessed       Communication   Communication: No difficulties  Cognition Arousal/Alertness: Awake/alert Behavior During Therapy: WFL for tasks assessed/performed Overall Cognitive Status:  Within Functional Limits for tasks assessed                      General Comments      Exercises     Assessment/Plan    PT Assessment Patient needs continued PT services  PT Problem List Decreased strength;Decreased activity tolerance;Decreased mobility;Pain;Decreased knowledge of precautions       PT Treatment Interventions DME instruction;Gait training;Stair training;Therapeutic  activities;Therapeutic exercise;Balance training;Patient/family education    PT Goals (Current goals can be found in the Care Plan section)  Acute Rehab PT Goals Patient Stated Goal: improve mobility without pain PT Goal Formulation: With patient Time For Goal Achievement: 08/08/16 Potential to Achieve Goals: Good    Frequency Min 2X/week   Barriers to discharge        Co-evaluation               End of Session Equipment Utilized During Treatment: Gait belt Activity Tolerance: Patient tolerated treatment well;Patient limited by pain Patient left: in chair;with call bell/phone within reach;with chair alarm set Nurse Communication: Mobility status PT Visit Diagnosis: Other abnormalities of gait and mobility (R26.89);Difficulty in walking, not elsewhere classified (R26.2)         Time: 0910-0930 PT Time Calculation (min) (ACUTE ONLY): 20 min   Charges:   PT Evaluation $PT Eval Low Complexity: 1 Procedure PT Treatments $Therapeutic Activity: 8-22 mins   PT G Codes:        9:42 AM, 08/02/2016 Rosamaria Lints, PT, DPT Physical Therapist - [place] [phone] (mobile)

## 2016-07-25 NOTE — Progress Notes (Signed)
1 Day Post-Op  Subjective: Patient's pain is adequately controlled she has no nausea or vomiting feels rumbling in her bowels but no gas passage at  Objective: Vital signs in last 24 hours: Temp:  [96.9 F (36.1 C)-98.1 F (36.7 C)] 98 F (36.7 C) (03/10 0439) Pulse Rate:  [64-75] 73 (03/10 0439) Resp:  [15-22] 19 (03/10 0439) BP: (94-142)/(47-84) 94/47 (03/10 0439) SpO2:  [93 %-100 %] 96 % (03/10 0439) Last BM Date: 07/23/16  Intake/Output from previous day: 03/09 0701 - 03/10 0700 In: 4702 [P.O.:240; I.V.:4362; IV Piggyback:100] Out: 1040 [Urine:840; Blood:200] Intake/Output this shift: Total I/O In: 289.6 [I.V.:289.6] Out: -   Physical exam:  Wound is dressed and clean calves are nontender abdomen is soft and nontender awake alert and oriented  Lab Results: CBC   Recent Labs  07/24/16 1438 07/25/16 0339  WBC 12.6* 7.4  HGB 12.0 10.4*  HCT 35.4 30.1*  PLT 181 160   BMET  Recent Labs  07/24/16 0628 07/24/16 1438 07/25/16 0339  NA 136  --  134*  K 3.5  --  3.6  CL  --   --  104  CO2  --   --  24  GLUCOSE 148*  --  171*  BUN  --   --  10  CREATININE  --  0.94 0.71  CALCIUM  --   --  7.7*   PT/INR No results for input(s): LABPROT, INR in the last 72 hours. ABG No results for input(s): PHART, HCO3 in the last 72 hours.  Invalid input(s): PCO2, PO2  Studies/Results: Dg C-arm 1-60 Min-no Report  Result Date: 07/24/2016 Fluoroscopy was utilized by the requesting physician.  No radiographic interpretation.    Anti-infectives: Anti-infectives    Start     Dose/Rate Route Frequency Ordered Stop   07/24/16 0000  cefoTEtan (CEFOTAN) 2 g in dextrose 5 % 50 mL IVPB  Status:  Discontinued     2 g 100 mL/hr over 30 Minutes Intravenous On call to O.R. 07/23/16 2352 07/24/16 1345      Assessment/Plan: s/p Procedure(s): LAPAROSCOPIC SIGMOID COLECTOMY COLON RESECTION SIGMOID CYSTOSCOPY WITH STENT PLACEMENT   Labs are reviewed. Patient is doing well at  this point we will continue clear liquids only until bowel function returns.  Lattie Hawichard E Cooper, MD, FACS  07/25/2016

## 2016-07-26 LAB — CBC WITH DIFFERENTIAL/PLATELET
Basophils Absolute: 0 10*3/uL (ref 0–0.1)
Basophils Relative: 0 %
EOS ABS: 0.1 10*3/uL (ref 0–0.7)
EOS PCT: 1 %
HCT: 30.3 % — ABNORMAL LOW (ref 35.0–47.0)
HEMOGLOBIN: 10.4 g/dL — AB (ref 12.0–16.0)
LYMPHS ABS: 1.4 10*3/uL (ref 1.0–3.6)
LYMPHS PCT: 18 %
MCH: 31.2 pg (ref 26.0–34.0)
MCHC: 34.2 g/dL (ref 32.0–36.0)
MCV: 91.1 fL (ref 80.0–100.0)
MONOS PCT: 11 %
Monocytes Absolute: 0.8 10*3/uL (ref 0.2–0.9)
Neutro Abs: 5.5 10*3/uL (ref 1.4–6.5)
Neutrophils Relative %: 70 %
PLATELETS: 154 10*3/uL (ref 150–440)
RBC: 3.32 MIL/uL — ABNORMAL LOW (ref 3.80–5.20)
RDW: 14.1 % (ref 11.5–14.5)
WBC: 7.9 10*3/uL (ref 3.6–11.0)

## 2016-07-26 LAB — BASIC METABOLIC PANEL
Anion gap: 4 — ABNORMAL LOW (ref 5–15)
BUN: <5 mg/dL — ABNORMAL LOW (ref 6–20)
CHLORIDE: 110 mmol/L (ref 101–111)
CO2: 25 mmol/L (ref 22–32)
CREATININE: 0.6 mg/dL (ref 0.44–1.00)
Calcium: 7.9 mg/dL — ABNORMAL LOW (ref 8.9–10.3)
GFR calc Af Amer: >60 mL/min (ref >60)
GFR calc non Af Amer: >60 mL/min (ref >60)
Glucose, Bld: 127 mg/dL — ABNORMAL HIGH (ref 65–99)
Potassium: 3.9 mmol/L (ref 3.5–5.1)
SODIUM: 139 mmol/L (ref 135–145)

## 2016-07-26 MED ORDER — HYDROCODONE-ACETAMINOPHEN 5-325 MG PO TABS
1.0000 | ORAL_TABLET | ORAL | Status: DC | PRN
  Administered 2016-07-26: 1 via ORAL
  Administered 2016-07-26: 2 via ORAL
  Administered 2016-07-26: 1 via ORAL
  Administered 2016-07-27 (×2): 2 via ORAL
  Administered 2016-07-28 (×2): 1 via ORAL
  Filled 2016-07-26 (×2): qty 2
  Filled 2016-07-26 (×2): qty 1
  Filled 2016-07-26: qty 2
  Filled 2016-07-26 (×2): qty 1

## 2016-07-26 NOTE — Progress Notes (Signed)
Initial Nutrition Assessment  DOCUMENTATION CODES:   Not applicable  INTERVENTION:  1.Recommend Boost Breeze po TID, each supplement provides 250 kcal and 9 grams of protein  NUTRITION DIAGNOSIS:   Increased nutrient needs related to wound healing as evidenced by estimated needs.  GOAL:   Patient will meet greater than or equal to 90% of their needs  MONITOR:   PO intake, I & O's, Labs, Supplement acceptance  REASON FOR ASSESSMENT:   Malnutrition Screening Tool    ASSESSMENT:   Tracy Haas is a 71 y.o. female who is known to the surgery service due to previous attacks of diverticulitis returns today to discuss her recurrent diverticulitis symptoms.. She is now s/p sigmoid colectomy, colon resection, cytoscopy stent placement  S/p procedures: LAPAROSCOPIC SIGMOID COLECTOMY COLON RESECTION SIGMOID CYSTOSCOPY WITH STENT PLACEMENT Weight down 5#/3.5% over 4 months, insignificant for time frame. Consuming all of liquid trays. Per surgery, had BM, gas today. No acute complaints Monitor for diet advancement   Diet Order:  Diet full liquid Room service appropriate? Yes; Fluid consistency: Thin  Skin:  Wound (see comment) (Closed incision to abdomen)  Last BM:  07/23/2016  Height:   Ht Readings from Last 1 Encounters:  07/24/16 5' (1.524 m)    Weight:   Wt Readings from Last 1 Encounters:  07/24/16 136 lb (61.7 kg)    Ideal Body Weight:  45.45 kg  BMI:  Body mass index is 26.56 kg/m.  Estimated Nutritional Needs:   Kcal:  1450 - 1750 calories  Protein:  61-74 gm  Fluid:  >/= 1.5L  EDUCATION NEEDS:   No education needs identified at this time  Dionne AnoWilliam M. Milli Woolridge, MS, RD LDN Inpatient Clinical Dietitian Pager [phone]

## 2016-07-26 NOTE — Progress Notes (Signed)
2 Days Post-Op  Subjective: Patient had large bowel movements and is passing gas. She feels well today  Objective: Vital signs in last 24 hours: Temp:  [98.1 F (36.7 C)-100.8 F (38.2 C)] 98.5 F (36.9 C) (03/11 0519) Pulse Rate:  [78-94] 93 (03/11 0519) Resp:  [20] 20 (03/11 0519) BP: (146-154)/(68-71) 151/71 (03/11 0519) SpO2:  [96 %-97 %] 97 % (03/11 0519) Last BM Date: 07/23/16  Intake/Output from previous day: 03/10 0701 - 03/11 0700 In: 3164.4 [P.O.:1500; I.V.:1664.4] Out: 2875 [Urine:2875] Intake/Output this shift: Total I/O In: -  Out: 350 [Urine:350]  Physical exam:  Abdomen is soft nontender wounds are clean and dressed Calves are nontender  Lab Results: CBC   Recent Labs  07/25/16 0339 07/26/16 0318  WBC 7.4 7.9  HGB 10.4* 10.4*  HCT 30.1* 30.3*  PLT 160 154   BMET  Recent Labs  07/25/16 0339 07/26/16 0318  NA 134* 139  K 3.6 3.9  CL 104 110  CO2 24 25  GLUCOSE 171* 127*  BUN 10 <5*  CREATININE 0.71 0.60  CALCIUM 7.7* 7.9*   PT/INR No results for input(s): LABPROT, INR in the last 72 hours. ABG No results for input(s): PHART, HCO3 in the last 72 hours.  Invalid input(s): PCO2, PO2  Studies/Results: Dg C-arm 1-60 Min-no Report  Result Date: 07/24/2016 Fluoroscopy was utilized by the requesting physician.  No radiographic interpretation.    Anti-infectives: Anti-infectives    Start     Dose/Rate Route Frequency Ordered Stop   07/24/16 0000  cefoTEtan (CEFOTAN) 2 g in dextrose 5 % 50 mL IVPB  Status:  Discontinued     2 g 100 mL/hr over 30 Minutes Intravenous On call to O.R. 07/23/16 2352 07/24/16 1345      Assessment/Plan: s/p Procedure(s): LAPAROSCOPIC SIGMOID COLECTOMY COLON RESECTION SIGMOID CYSTOSCOPY WITH STENT PLACEMENT   DC Foley advance diet possibly home tomorrow  Lattie Hawichard E Cooper, MD, FACS  07/26/2016

## 2016-07-27 LAB — SURGICAL PATHOLOGY

## 2016-07-27 MED ORDER — SIMVASTATIN 20 MG PO TABS
20.0000 mg | ORAL_TABLET | Freq: Every day | ORAL | Status: DC
  Administered 2016-07-27 – 2016-07-28 (×2): 20 mg via ORAL
  Filled 2016-07-27 (×2): qty 1

## 2016-07-27 MED ORDER — LEVOTHYROXINE SODIUM 75 MCG PO TABS
75.0000 ug | ORAL_TABLET | Freq: Every day | ORAL | Status: DC
  Administered 2016-07-28: 75 ug via ORAL
  Filled 2016-07-27: qty 1

## 2016-07-27 MED ORDER — BISOPROLOL-HYDROCHLOROTHIAZIDE 5-6.25 MG PO TABS
1.0000 | ORAL_TABLET | Freq: Every day | ORAL | Status: DC
  Administered 2016-07-27 – 2016-07-28 (×2): 1 via ORAL
  Filled 2016-07-27 (×2): qty 1

## 2016-07-27 MED ORDER — PANTOPRAZOLE SODIUM 40 MG PO TBEC
40.0000 mg | DELAYED_RELEASE_TABLET | Freq: Every day | ORAL | Status: DC
  Administered 2016-07-27 – 2016-07-28 (×2): 40 mg via ORAL
  Filled 2016-07-27 (×2): qty 1

## 2016-07-27 MED ORDER — RAMIPRIL 10 MG PO CAPS
10.0000 mg | ORAL_CAPSULE | Freq: Every day | ORAL | Status: DC
  Administered 2016-07-27 – 2016-07-28 (×2): 10 mg via ORAL
  Filled 2016-07-27 (×2): qty 1

## 2016-07-27 MED ORDER — BOOST / RESOURCE BREEZE PO LIQD
1.0000 | Freq: Three times a day (TID) | ORAL | Status: DC
  Administered 2016-07-27 (×2): 1 via ORAL

## 2016-07-27 NOTE — Progress Notes (Signed)
  Visit with patient this evening. She reports she is doing very well. Tolerating a diet and her pain is controlled for medications. She is having bowel function.  On exam her abdomen is soft, nontender. Staples are intact at all incision sites of any evidence of erythema or drainage.  Discussed with patient that she is doing very well. Will likely be able to be discharged home tomorrow.  Ricarda Frameharles Ailin Rochford, MD [facility] General Surgeon [facility]  Day [facility] [phone](7a-7p) [phone] Night [facility] [phone](7p-7a) [phone]

## 2016-07-27 NOTE — Progress Notes (Signed)
07/27/2016  Subjective: Patient is 3 Days Post-Op s/p laparoscopic converted to open sigmoidectomy with Dr. Tonita CongWoodham.  No acute events overnight.  Patient has bowel function and tolerating a full liquid diet.  Pain is controlled.  Ambulating.  Vital signs: Temp:  [98 F (36.7 C)-98.4 F (36.9 C)] 98 F (36.7 C) (03/12 0531) Pulse Rate:  [82-83] 83 (03/12 0531) Resp:  [18-20] 20 (03/12 0531) BP: (144-162)/(76-82) 144/79 (03/12 0531) SpO2:  [97 %-100 %] 100 % (03/12 0531)   Intake/Output: 03/11 0701 - 03/12 0700 In: 4252.7 [P.O.:1500; I.V.:2752.7] Out: 2550 [Urine:2550] Last BM Date: 07/26/16  Physical Exam: Constitutional: No acute distress Abdomen:  Soft, nondistended, appropriately tender to palpation.  Incisions are clean, dry, intact, with staples in place.  Labs:   Recent Labs  07/25/16 0339 07/26/16 0318  WBC 7.4 7.9  HGB 10.4* 10.4*  HCT 30.1* 30.3*  PLT 160 154    Recent Labs  07/25/16 0339 07/26/16 0318  NA 134* 139  K 3.6 3.9  CL 104 110  CO2 24 25  GLUCOSE 171* 127*  BUN 10 <5*  CREATININE 0.71 0.60  CALCIUM 7.7* 7.9*   No results for input(s): LABPROT, INR in the last 72 hours.  Imaging: No results found.  Assessment/Plan: 71 yo female s/p lap converted to open sigmoidectomy.  --Advance diet to soft this morning. --d/c iv fluids --continue oral pain medication with iv for breakthrough --restart home medications. --d/c home tomorrow 3/13.   Howie IllJose Luis Thermon Zulauf, MD [facility]

## 2016-07-28 MED ORDER — HYDROCODONE-ACETAMINOPHEN 5-325 MG PO TABS: 1.0000 | ORAL_TABLET | ORAL | 0 refills | Status: DC | PRN

## 2016-07-28 NOTE — Plan of Care (Signed)
Problem: Bowel/Gastric: Goal: Will not experience complications related to bowel motility Outcome: Adequate for Discharge Passing flatus; had BM yesterday; ambulating well.

## 2016-07-28 NOTE — Discharge Instructions (Signed)
Follow all MD discharge instructions. Take all medications as prescribed. Keep all follow up appointments. If your symptoms return, call your doctor. If you experience any new symptoms that are of concern to you or that are bothersome to you, call your doctor. For all questions and/or concerns, call your doctor. ° °If you experience any bleeding or discharge from your incision sites, redness or swelling at your incision sites, fever, pain uncontrolled by medication, increase in pain, or any nausea, call your doctor.  ° ° If you have a medical emergency, call 911 °

## 2016-07-28 NOTE — Progress Notes (Signed)
07/28/2016 2:24 PM  BP (!) 150/72 (BP Location: Right Arm)   Pulse 80   Temp 97.9 F (36.6 C) (Oral)   Resp 18   Ht 5' (1.524 m)   Wt 61.7 kg (136 lb)   SpO2 100%   BMI 26.56 kg/m  Patient discharged per MD orders. Discharge instructions reviewed with patient and patient verbalized understanding. IV removed per policy. Prescriptions discussed and given to patient. Discharged via wheelchair escorted by [employer].  Ron ParkerHerron, Debbie Bellucci D, RN

## 2016-07-28 NOTE — Anesthesia Postprocedure Evaluation (Signed)
Anesthesia Post Note  Patient: Tracy Haas  Procedure(s) Performed: Procedure(s) (LRB): LAPAROSCOPIC SIGMOID COLECTOMY (N/A) COLON RESECTION SIGMOID (N/A) CYSTOSCOPY WITH STENT PLACEMENT (Bilateral)  Patient location during evaluation: PACU Anesthesia Type: General Level of consciousness: awake and alert Pain management: pain level controlled Vital Signs Assessment: post-procedure vital signs reviewed and stable Respiratory status: spontaneous breathing, nonlabored ventilation, respiratory function stable and patient connected to nasal cannula oxygen Cardiovascular status: blood pressure returned to baseline and stable Postop Assessment: no signs of nausea or vomiting Anesthetic complications: no     Last Vitals:  Vitals:   07/27/16 2049 07/28/16 0430  BP: 137/84 (!) 150/72  Pulse: 80 80  Resp: 17 18  Temp: 36.7 C 36.6 C    Last Pain:  Vitals:   07/28/16 1121  TempSrc:   PainSc: 2                  Cleda MccreedyJoseph K Piscitello

## 2016-07-28 NOTE — Discharge Summary (Signed)
Patient ID: Tracy Haas MRN: [id number] DOB/AGE: Sep 22, 1945 71 y.o.  Admit date: 07/24/2016 Discharge date: 07/28/2016   Discharge Diagnoses:  Active Problems:   Diverticulitis   Procedures:  Laparoscopic converted to open sigmoidectomy  Hospital Course:  Patient was admitted on 07/24/16 and underwent the above elective procedure.  She tolerated the procedure well and was transferred to floor post-operatively.  Her diet was slowly advanced and fluids were discontinued as she tolerated appropriate po.  Her pain control was transitioned from IV to oral medications.  Her foley catheter was removed on POD#2 without complications.  She regained bowel function by POD#2.  She was ambulating, tolerating a diet, had her pain well controlled, and was deemed ready for discharge to home.  On exam, she was in no acute distress, with stable vital signs.  Her abdomen was soft, non-distended, and appropriately tender over the incisions.  All incisions were clean, dry, and intact with staples in place.  Consults: None  Disposition: 01-Home or Self Care  Discharge Instructions    Call MD for:  difficulty breathing, headache or visual disturbances    Complete by:  As directed    Call MD for:  persistant nausea and vomiting    Complete by:  As directed    Call MD for:  redness, tenderness, or signs of infection (pain, swelling, redness, odor or green/yellow discharge around incision site)    Complete by:  As directed    Call MD for:  severe uncontrolled pain    Complete by:  As directed    Call MD for:  temperature >100.4    Complete by:  As directed    Diet - low sodium heart healthy    Complete by:  As directed    Discharge instructions    Complete by:  As directed    1.  Patient may shower but do not scrub wounds heavily and dab dry only. 2.  Do not submerge wounds in pool/tub 3.  Do not remove staples.   Driving Restrictions    Complete by:  As directed    Do not drive while taking narcotics  for pain control.   Increase activity slowly    Complete by:  As directed    Lifting restrictions    Complete by:  As directed    No heavy lifting of more than 10-15 lbs for 4 weeks.   No dressing needed    Complete by:  As directed      Allergies as of 07/28/2016   No Known Allergies     Medication List    TAKE these medications   CALCIUM 500 + D3 250-500 MG-UNIT Chew Generic drug:  Ca Phosphate-Cholecalciferol Chew 2 each by mouth daily.   HYDROcodone-acetaminophen 5-325 MG tablet Commonly known as:  NORCO/VICODIN Take 1-2 tablets by mouth every 4 (four) hours as needed for moderate pain.   levothyroxine 75 MCG tablet Commonly known as:  SYNTHROID, LEVOTHROID Take 1 tablet by mouth daily before breakfast.   omeprazole 20 MG capsule Commonly known as:  PRILOSEC Take 1 capsule by mouth daily.   potassium chloride 10 MEQ tablet Commonly known as:  K-DUR Take 10 mEq by mouth 2 (two) times daily.   potassium chloride SA 20 MEQ tablet Commonly known as:  K-DUR,KLOR-CON Take 1 tablet (20 mEq total) by mouth 2 (two) times daily.   RA VITAMIN B-12 TR 1000 MCG Tbcr Generic drug:  Cyanocobalamin Take 1 tablet by mouth daily.   ramipril 10 MG  capsule Commonly known as:  ALTACE Take 10 mg by mouth daily.   simvastatin 20 MG tablet Commonly known as:  ZOCOR Take 20 mg by mouth daily.   ZIAC 5-6.25 MG tablet Generic drug:  bisoprolol-hydrochlorothiazide Take 1 tablet by mouth daily.      Follow-up Information    Ricarda Frameharles Woodham, MD Follow up in 1 week(s).   Specialty:  General Surgery Why:  For staple removal Contact information: [address] [place] [address] [phone]

## 2016-07-28 NOTE — Plan of Care (Signed)
Problem: Pain Managment: Goal: General experience of comfort will improve Outcome: Adequate for Discharge Pain meds effective for pain control; one PRN overnight;

## 2016-07-29 ENCOUNTER — Other Ambulatory Visit: Payer: Self-pay

## 2016-07-29 ENCOUNTER — Telehealth: Payer: Self-pay

## 2016-07-29 ENCOUNTER — Other Ambulatory Visit
Admission: RE | Admit: 2016-07-29 | Discharge: 2016-07-29 | Disposition: A | Discharge status: Home / Self Care | Payer: Medicare Other | Source: Ambulatory Visit | Attending: Surgery | Admitting: Surgery

## 2016-07-29 ENCOUNTER — Ambulatory Visit (INDEPENDENT_AMBULATORY_CARE_PROVIDER_SITE_OTHER): Payer: Medicare Other | Admitting: Surgery

## 2016-07-29 ENCOUNTER — Encounter: Payer: Self-pay | Admitting: Surgery

## 2016-07-29 VITALS — BP 161/87 | HR 89 | Temp 98.7°F | Wt 133.0 lb

## 2016-07-29 DIAGNOSIS — K5720 Diverticulitis of large intestine with perforation and abscess without bleeding: Secondary | ICD-10-CM

## 2016-07-29 DIAGNOSIS — R1084 Generalized abdominal pain: Secondary | ICD-10-CM | POA: Insufficient documentation

## 2016-07-29 LAB — CBC WITH DIFFERENTIAL/PLATELET
Basophils Absolute: 0 10*3/uL (ref 0–0.1)
Basophils Relative: 0 %
EOS ABS: 0 10*3/uL (ref 0–0.7)
Eosinophils Relative: 0 %
HCT: 34.7 % — ABNORMAL LOW (ref 35.0–47.0)
HEMOGLOBIN: 11.9 g/dL — AB (ref 12.0–16.0)
LYMPHS ABS: 0.7 10*3/uL — AB (ref 1.0–3.6)
Lymphocytes Relative: 9 %
MCH: 30.9 pg (ref 26.0–34.0)
MCHC: 34.3 g/dL (ref 32.0–36.0)
MCV: 89.8 fL (ref 80.0–100.0)
Monocytes Absolute: 0.7 10*3/uL (ref 0.2–0.9)
Monocytes Relative: 8 %
NEUTROS ABS: 7.4 10*3/uL — AB (ref 1.4–6.5)
Neutrophils Relative %: 83 %
Platelets: 266 10*3/uL (ref 150–440)
RBC: 3.87 MIL/uL (ref 3.80–5.20)
RDW: 14 % (ref 11.5–14.5)
WBC: 8.8 10*3/uL (ref 3.6–11.0)

## 2016-07-29 MED ORDER — PROMETHAZINE HCL 12.5 MG RE SUPP: 12.5000 mg | Freq: Four times a day (QID) | RECTAL | 0 refills | Status: DC | PRN

## 2016-07-29 NOTE — Progress Notes (Signed)
Outpatient postop visit  07/29/2016  Tracy Haas is an 71 y.o. female.    Procedure: Sigmoid colon resection  CC: Abdominal pain  HPI: Patient called this morning stating that she was having abdominal pain and we asked her to come to the office to examine her. She was sent for a white blood cell count prior to being seen in the white blood cell count was normal. Patient is on omeprazole but noticed some heartburn-like feeling and vomited on an empty stomach and it was green. She only vomited one time and that was early this morning before she ate anything. She is having normal stools no blood no melena and otherwise feels well.  Medications reviewed.    Physical Exam:  There were no vitals taken for this visit.    PE: Vital signs reviewed and stable afebrile. Patient is awake alert oriented and wearing makeup Abdomen is soft nondistended nontympanitic and nontender wound is clean with staples no erythema no drainage  Calves are nontender    Assessment/Plan:  Popliteal count of 8.8 in a patient who had a single emesis and some abdominal pain I suspect that it was all gastric pain related as she is feeling better now her vital signs are normal and her exam is normal. I would recommend giving her a prescription of some Phenergan suppositories should she need it and continue the omeprazole and be seen next week for staple removal.  Lattie Hawichard E Fannye Myer, MD, FACS

## 2016-07-29 NOTE — Telephone Encounter (Signed)
Patient called in with complaints of vomiting that she states may be attributed to taking the pain medication that she was prescribed. She reports also having abdominal pain that feels exactly like her abdominal pain with her diverticulitis.  Spoke with Dr. Excell Seltzerooper. We will get a CBC and have her report to clinic at 1400 today.  Patient placed on schedule and she is aware of labs and appointment.

## 2016-07-29 NOTE — Patient Instructions (Signed)
Please call our office with any questions or concerns.  Please do not submerge in a tub, hot tub, or pool until incisions are completely sealed.  Use sun block to incision area over the next year if this area will be exposed to sun. This helps decrease scarring.  If you develop redness, drainage, or pain at incision sites- call our office immediately and speak with a nurse.  

## 2016-08-03 ENCOUNTER — Ambulatory Visit (INDEPENDENT_AMBULATORY_CARE_PROVIDER_SITE_OTHER): Payer: Medicare Other | Admitting: General Surgery

## 2016-08-03 ENCOUNTER — Encounter: Payer: Self-pay | Admitting: General Surgery

## 2016-08-03 VITALS — BP 147/90 | HR 90 | Temp 98.2°F | Ht 60.0 in | Wt 131.2 lb

## 2016-08-03 DIAGNOSIS — Z4889 Encounter for other specified surgical aftercare: Secondary | ICD-10-CM

## 2016-08-03 NOTE — Patient Instructions (Signed)
Please see your follow up appointment listed below. Please call our office if you have questions or concerns. 

## 2016-08-03 NOTE — Progress Notes (Signed)
Outpatient Surgical Follow Up  08/03/2016  Tracy Haas is an 71 y.o. female.   Chief Complaint  Patient presents with  . Routine Post Op    Laparoscopic converted to open sigmoid Coloectomy-07-24-16=Dr.Will Schier    HPI: 71 year old female returns to clinic 10 days status post laparoscopic converted to open sigmoid colectomy. She reports doing very well. She was seen in clinic last week subsequent to nausea. This was related to pain pill usage without eating. She's had no nausea or vomiting since that time. She is on requiring about 1 pain pill a day to help her sleep at night. She is eating well and having normal bowel function. She denies any fevers, chills, nausea, vomiting, chest pain, shortness of breath, diarrhea, constipation.  Past Medical History:  Diagnosis Date  . Adult hypothyroidism 11/05/2013  . Anxiety   . B12 deficiency 11/07/2013  . BP (high blood pressure) 02/11/2012  . Diverticulitis   . Family history of adverse reaction to anesthesia    mother- possible problems with intubation.  . Fatty infiltration of liver 04/18/2012   Overview:  [place] finding 03/2012   . GERD (gastroesophageal reflux disease)   . Hematuria 01/22/2012  . High cholesterol   . HLD (hyperlipidemia) 10/14/2012  . Hypertension   . Hypothyroidism   . Panic attacks   . Stenosis of intestine     Past Surgical History:  Procedure Laterality Date  . ABDOMINAL HYSTERECTOMY  1989  . BREAST BIOPSY Right 1990's   neg  . COLON RESECTION SIGMOID N/A 07/24/2016   Procedure: COLON RESECTION SIGMOID;  Surgeon: Tracy Frame, MD;  Location: [facility];  Service: General;  Laterality: N/A;  . COLONOSCOPY WITH PROPOFOL N/A 04/16/2015   Procedure: COLONOSCOPY WITH PROPOFOL;  Surgeon: Midge Minium, MD;  Location: [facility];  Service: Endoscopy;  Laterality: N/A;  . CYSTOSCOPY WITH STENT PLACEMENT Bilateral 07/24/2016   Procedure: CYSTOSCOPY WITH STENT PLACEMENT;  Surgeon: Hildred Laser, MD;  Location: [facility];   Service: Urology;  Laterality: Bilateral;  . LAPAROSCOPIC SIGMOID COLECTOMY N/A 07/24/2016   Procedure: LAPAROSCOPIC SIGMOID COLECTOMY;  Surgeon: Tracy Frame, MD;  Location: [facility];  Service: General;  Laterality: N/A;  . SMALL INTESTINE SURGERY  1989   Dr. Evette Cristal- (After Hysterectomy)  . TRIGGER FINGER RELEASE Right 04/15/2016   Procedure: RELEASE TRIGGER FINGER/A-1 PULLEY;  Surgeon: Christena Flake, MD;  Location: [facility];  Service: Orthopedics;  Laterality: Right;    Family History  Problem Relation Age of Onset  . Hypertension Mother   . Cirrhosis Mother   . Hypertension Father   . Diabetes Father   . Kidney failure Father     Social History:  reports that she has never smoked. She has never used smokeless tobacco. She reports that she does not drink alcohol or use drugs.  Allergies: No Known Allergies  Medications reviewed.    ROS A multipoint review of systems was completed. All pertinent positives and negatives are documented within the history of present illness remainder are negative.   BP (!) 147/90   Pulse 90   Temp 98.2 F (36.8 C) (Oral)   Ht 5' (1.524 m)   Wt 59.5 kg (131 lb 3.2 oz)   BMI 25.62 kg/m   Physical Exam Gen.: No acute distress Chest: Clear to auscultation Heart: Regular rhythm Abdomen: Soft, nontender, nondistended. Midline incision and right-sided laparoscopic incisions well approximated with staples. No evidence of erythema or drainage.    No results found for this or any  previous visit (from the past 48 hour(s)). No results found.  Assessment/Plan:  1. Aftercare following surgery 71 year old female status post sigmoid colectomy for diverticulitis. Pathology reviewed with the patient. Staples removed and replaced with Steri-Strips today without any difficulty. Discussed continued recovery and anticipation and normal postoperative precautions. She voiced understanding. She'll follow-up in clinic again in 3 weeks for an  additional wound check prior to being discharged from clinic.     Tracy Frameharles Nekeshia Lenhardt, MD FACS General Surgeon  08/03/2016,3:42 PM

## 2016-08-10 ENCOUNTER — Ambulatory Visit: Payer: Medicare Other

## 2016-08-10 DIAGNOSIS — Z4889 Encounter for other specified surgical aftercare: Secondary | ICD-10-CM

## 2016-08-10 NOTE — Patient Instructions (Signed)
Please call our office if you have questions or concerns.   

## 2016-08-26 ENCOUNTER — Encounter: Payer: Self-pay | Admitting: General Surgery

## 2016-08-26 ENCOUNTER — Ambulatory Visit (INDEPENDENT_AMBULATORY_CARE_PROVIDER_SITE_OTHER): Payer: Medicare Other | Admitting: General Surgery

## 2016-08-26 VITALS — BP 169/78 | HR 69 | Temp 98.2°F | Ht 60.0 in | Wt 131.0 lb

## 2016-08-26 DIAGNOSIS — Z4889 Encounter for other specified surgical aftercare: Secondary | ICD-10-CM

## 2016-08-26 NOTE — Progress Notes (Signed)
Outpatient Surgical Follow Up  08/26/2016  Tracy Haas is an 71 y.o. female.   Chief Complaint  Patient presents with  . Routine Post Op    Laparoscopic converted to open Sigmoid Colectomy (07/24/16)- Dr. Tonita Cong    HPI: 71 year old female returns to clinic for follow-up now 1 month status post laparoscopic converted to open sigmoid colectomy. Patient reports eating well and having normal bowel function. Her energy level has been returning and she has been increasing her activity. She reports occasional twinges of discomfort in her left lower quadrant at the end of the day after being active. He does not bother her during the day. She denies any fevers, chills, nausea, vomiting, chest pain, shortness of breath, diarrhea, constipation.  Past Medical History:  Diagnosis Date  . Adult hypothyroidism 11/05/2013  . Anxiety   . B12 deficiency 11/07/2013  . BP (high blood pressure) 02/11/2012  . Diverticulitis   . Family history of adverse reaction to anesthesia    mother- possible problems with intubation.  . Fatty infiltration of liver 04/18/2012   Overview:  [place] finding 03/2012   . GERD (gastroesophageal reflux disease)   . Hematuria 01/22/2012  . High cholesterol   . HLD (hyperlipidemia) 10/14/2012  . Hypertension   . Hypothyroidism   . Panic attacks   . Stenosis of intestine     Past Surgical History:  Procedure Laterality Date  . ABDOMINAL HYSTERECTOMY  1989  . BREAST BIOPSY Right 1990's   neg  . COLON RESECTION SIGMOID N/A 07/24/2016   Procedure: COLON RESECTION SIGMOID;  Surgeon: Ricarda Frame, MD;  Location: [facility];  Service: General;  Laterality: N/A;  . COLONOSCOPY WITH PROPOFOL N/A 04/16/2015   Procedure: COLONOSCOPY WITH PROPOFOL;  Surgeon: Midge Minium, MD;  Location: [facility];  Service: Endoscopy;  Laterality: N/A;  . CYSTOSCOPY WITH STENT PLACEMENT Bilateral 07/24/2016   Procedure: CYSTOSCOPY WITH STENT PLACEMENT;  Surgeon: Hildred Laser, MD;  Location: [facility];  Service: Urology;  Laterality: Bilateral;  . LAPAROSCOPIC SIGMOID COLECTOMY N/A 07/24/2016   Procedure: LAPAROSCOPIC SIGMOID COLECTOMY;  Surgeon: Ricarda Frame, MD;  Location: [facility];  Service: General;  Laterality: N/A;  . SMALL INTESTINE SURGERY  1989   Dr. Evette Cristal- (After Hysterectomy)  . TRIGGER FINGER RELEASE Right 04/15/2016   Procedure: RELEASE TRIGGER FINGER/A-1 PULLEY;  Surgeon: Christena Flake, MD;  Location: [facility];  Service: Orthopedics;  Laterality: Right;    Family History  Problem Relation Age of Onset  . Hypertension Mother   . Cirrhosis Mother   . Hypertension Father   . Diabetes Father   . Kidney failure Father     Social History:  reports that she has never smoked. She has never used smokeless tobacco. She reports that she does not drink alcohol or use drugs.  Allergies: No Known Allergies  Medications reviewed.    ROS A multipoint review of systems was completed. All pertinent positives and negatives are documented within the history of present illness the remainder are negative   BP (!) 169/78   Pulse 69   Temp 98.2 F (36.8 C) (Oral)   Ht 5' (1.524 m)   Wt 59.4 kg (131 lb)   BMI 25.58 kg/m   Physical Exam Gen.: No acute distress Chest: Clear to auscultation Heart: Regular rhythm Abdomen: Soft, nontender, nondistended. Well healed surgical incision sites evidence of erythema or drainage.    No results found for this or any previous visit (from the past 48 hour(s)). No results  found.  Assessment/Plan:  1. Aftercare following surgery 71 year old female status post sigmoid colectomy. Doing very well. Discussed the signs and symptoms of infection, hernia formation, wound disruption. She voiced understanding. Otherwise provided with standard postoperative precautions and anticipation to return to normal activities. She voiced understanding and will follow-up in clinic on an as-needed basis.     Ricarda Frame, MD  FACS General Surgeon  08/26/2016,2:43 PM

## 2016-09-24 ENCOUNTER — Telehealth: Payer: Self-pay

## 2016-09-24 NOTE — Telephone Encounter (Signed)
Patient had surgery on 07/24/16, Laparoscopic Sigmoid Colectomy and was last seen on April 11.  At the time, she was hurting a lot on her left side. It had gotten better but for the last 4 days she has been experiencing the pain again. It feels like its pulling every time she attempts to get into the bed and she states it's gotten severe. She would like to know what she should do and if there is a such thing as an incisional hernia. Please call patient and advice.

## 2016-09-24 NOTE — Telephone Encounter (Signed)
Returned phone call to patient at this time. Patient states that she has been having right lower quadrant abdominal pain intermittently in some positions. States that it is a "pulling" sensation with coughing, bending, and sneezing. Is not woken up by this. Bowel Movements are 1-2 times daily. Denies straining with bowel movements. Denies fever/ chills and nausea/vomiting. Has been taking temp and it is staying around 98.6. Denies bulge at any of the incision sites. States that counter pressure helps. Pain is intermittently painful multiple times daily.   Asked patient to try Ibuprofen 600mg  TID around the clock with food on her stomach as well as Ice 4-5 times daily for 15-20 minutes each time. I also suggested that she may use an abdominal binder to see if the counter pressure will help with her discomfort. She agreed with this plan and she will follow-up in clinic next week after trying these suggestions over the weekend.

## 2016-10-01 ENCOUNTER — Encounter: Payer: Self-pay | Admitting: General Surgery

## 2016-10-01 ENCOUNTER — Ambulatory Visit (INDEPENDENT_AMBULATORY_CARE_PROVIDER_SITE_OTHER): Payer: Medicare Other | Admitting: General Surgery

## 2016-10-01 VITALS — BP 191/97 | HR 65 | Temp 98.2°F | Ht 65.0 in | Wt 131.8 lb

## 2016-10-01 DIAGNOSIS — Z4889 Encounter for other specified surgical aftercare: Secondary | ICD-10-CM

## 2016-10-01 NOTE — Patient Instructions (Signed)
Please follow up with Dr.Miller regarding your elevated blood pressure today.  Please call our office if you have any questions or concerns.

## 2016-10-01 NOTE — Progress Notes (Signed)
Outpatient Surgical Follow Up  10/01/2016  Tracy Haas is an 71 y.o. female.   Chief Complaint  Patient presents with  . Routine Post Op    Sigmoid Colectomy-07-24-16-Dr.Melonee Gerstel    HPI: 71 year old female returns to clinic for follow-up. She is 2 months status post sigmoid colectomy. She reports that last week she developed severe right-sided abdominal pain that went from her right lower quadrant incision for midline. It was worsened with movement and improved with lying flat. She is afraid she is developing a hernia. She denies any bulges. She states the pain resolved on its own on Monday and she has not had any pain like that since. She is eating well and having normal bowel function. She denies any fevers, chills, nausea, vomiting, chest pain, shortness breath, diarrhea, constipation.  Past Medical History:  Diagnosis Date  . Adult hypothyroidism 11/05/2013  . Anxiety   . B12 deficiency 11/07/2013  . BP (high blood pressure) 02/11/2012  . Diverticulitis   . Family history of adverse reaction to anesthesia    mother- possible problems with intubation.  . Fatty infiltration of liver 04/18/2012   Overview:  [place] finding 03/2012   . GERD (gastroesophageal reflux disease)   . Hematuria 01/22/2012  . High cholesterol   . HLD (hyperlipidemia) 10/14/2012  . Hypertension   . Hypothyroidism   . Panic attacks   . Stenosis of intestine [facility](HCC)     Past Surgical History:  Procedure Laterality Date  . ABDOMINAL HYSTERECTOMY  1989  . BREAST BIOPSY Right 1990's   neg  . COLON RESECTION SIGMOID N/A 07/24/2016   Procedure: COLON RESECTION SIGMOID;  Surgeon: Ricarda Frameharles Bricen Victory, MD;  Location: [facility];  Service: General;  Laterality: N/A;  . COLONOSCOPY WITH PROPOFOL N/A 04/16/2015   Procedure: COLONOSCOPY WITH PROPOFOL;  Surgeon: Midge Miniumarren Wohl, MD;  Location: [facility];  Service: Endoscopy;  Laterality: N/A;  . CYSTOSCOPY WITH STENT PLACEMENT Bilateral 07/24/2016   Procedure: CYSTOSCOPY WITH STENT  PLACEMENT;  Surgeon: Hildred LaserBrian James Budzyn, MD;  Location: [facility];  Service: Urology;  Laterality: Bilateral;  . LAPAROSCOPIC SIGMOID COLECTOMY N/A 07/24/2016   Procedure: LAPAROSCOPIC SIGMOID COLECTOMY;  Surgeon: Ricarda Frameharles Tamarion Haymond, MD;  Location: [facility];  Service: General;  Laterality: N/A;  . SMALL INTESTINE SURGERY  1989   Dr. Evette CristalSankar- (After Hysterectomy)  . TRIGGER FINGER RELEASE Right 04/15/2016   Procedure: RELEASE TRIGGER FINGER/A-1 PULLEY;  Surgeon: Christena FlakeJohn J Poggi, MD;  [facility];  Service: Orthopedics;  Laterality: Right;    Family History  Problem Relation Age of Onset  . Hypertension Mother   . Cirrhosis Mother   . Hypertension Father   . Diabetes Father   . Kidney failure Father     Social History:  reports that she has never smoked. She has never used smokeless tobacco. She reports that she does not drink alcohol or use drugs.  Allergies: No Known Allergies  Medications reviewed.    ROS A multipoint review of systems was completed. All pertinent positives and negatives are documented in the history of present illness and remainder are negative   BP (!) 191/97   Pulse 65   Temp 98.2 F (36.8 C) (Oral)   Ht 5\' 5"  (1.651 m)   Wt 59.8 kg (131 lb 12.8 oz)   BMI 21.93 kg/m   Physical Exam Gen.: No acute distress Chest: Clear to auscultation Heart: Regular rhythm Abdomen: Soft, nontender, nondistended. Well healed incisions from her sigmoid colectomy. No evidence of hernia or infection.  No results found for this or any previous visit (from the past 48 hour(s)). No results found.  Assessment/Plan:  1. Aftercare following surgery 71 year old female 2 months status post sigmoid colectomy. Doing well. Pain that had her [place] has resolved. Discussed the anticipated recovery and that occasional soreness and pain is normal for 6-8 months after surgery. Reiterated to the patient that if she is worried about something to call clinic and be seen.  She voiced understanding and will follow-up in clinic on an as-needed basis.     Ricarda Frame, MD FACS General Surgeon  10/01/2016,10:41 AM

## 2016-11-23 ENCOUNTER — Other Ambulatory Visit: Payer: Self-pay | Admitting: Internal Medicine

## 2016-11-23 DIAGNOSIS — N6320 Unspecified lump in the left breast, unspecified quadrant: Secondary | ICD-10-CM

## 2016-12-21 IMAGING — US US BREAST*L* LIMITED INC AXILLA
1 series · 8 of 8 positions shown · non-contrast
Comparison: Previous exam(s).

ACR Breast Density Category a: The breast tissue is almost entirely
fatty.

CLINICAL DATA: Screening recall for possible left breast masses.
Patient had a traumatic fall February 2015 with significant bruising
of the superior left breast. The patient states she still has
tenderness in the upper left breast.

EXAM:
2D DIGITAL DIAGNOSTIC UNILATERAL LEFT MAMMOGRAM WITH CAD AND ADJUNCT
TOMO
LEFT BREAST ULTRASOUND

[Series 1: us breast*left* limited inc axilla · 0.05mm/px · 8 of 8 slices shown]
[im 1/8]
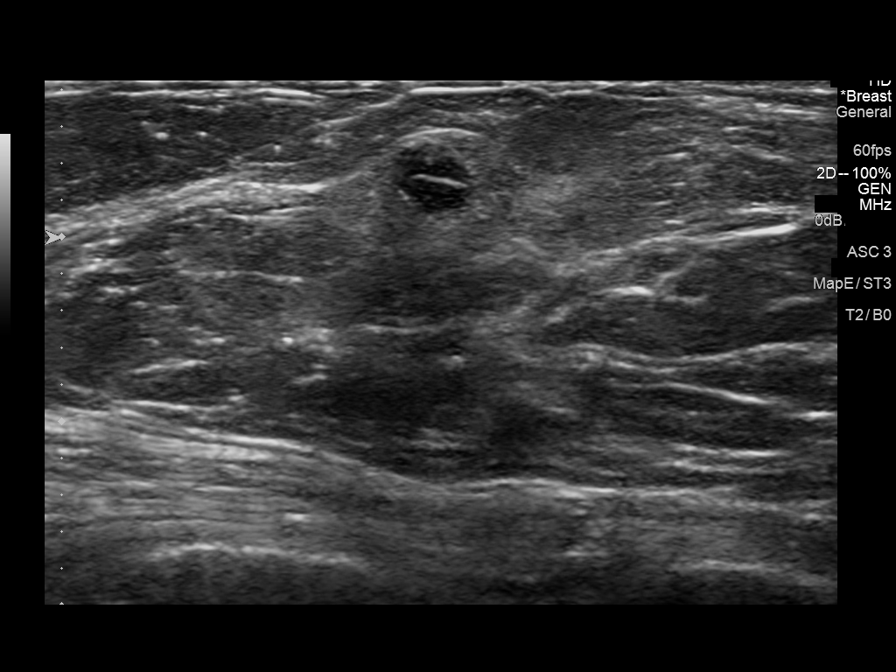
[im 2/8]
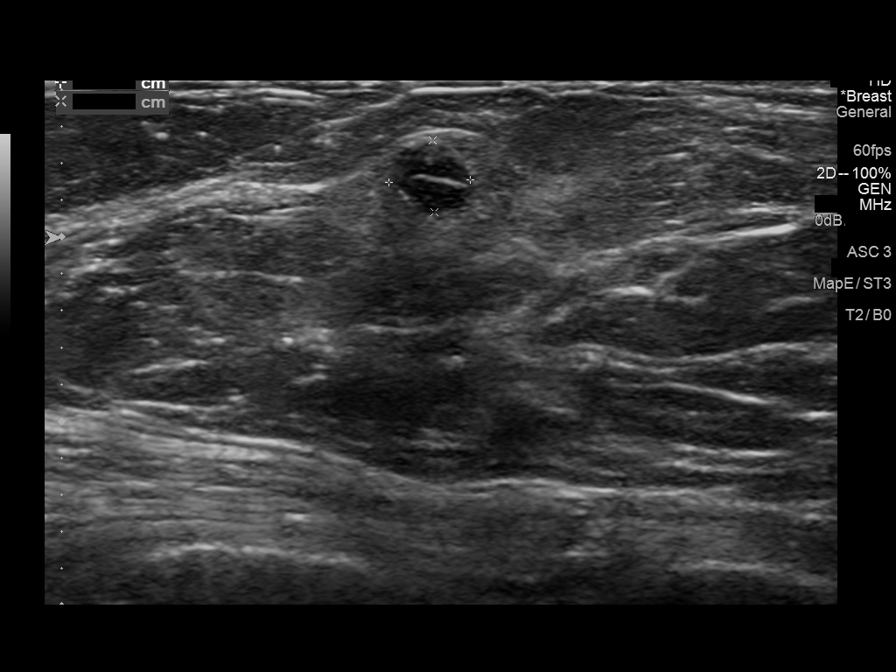
[im 3/8]
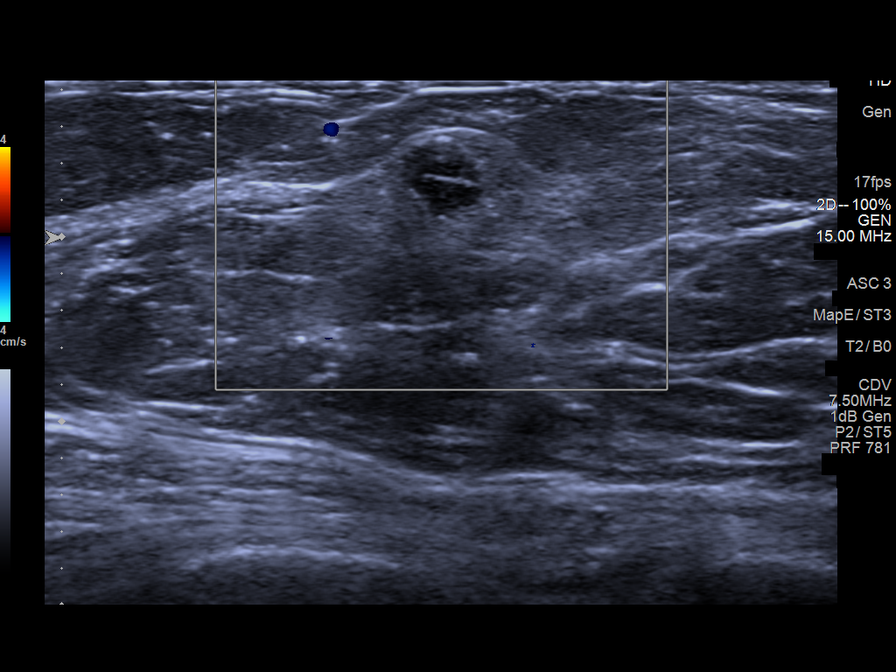
[im 4/8]
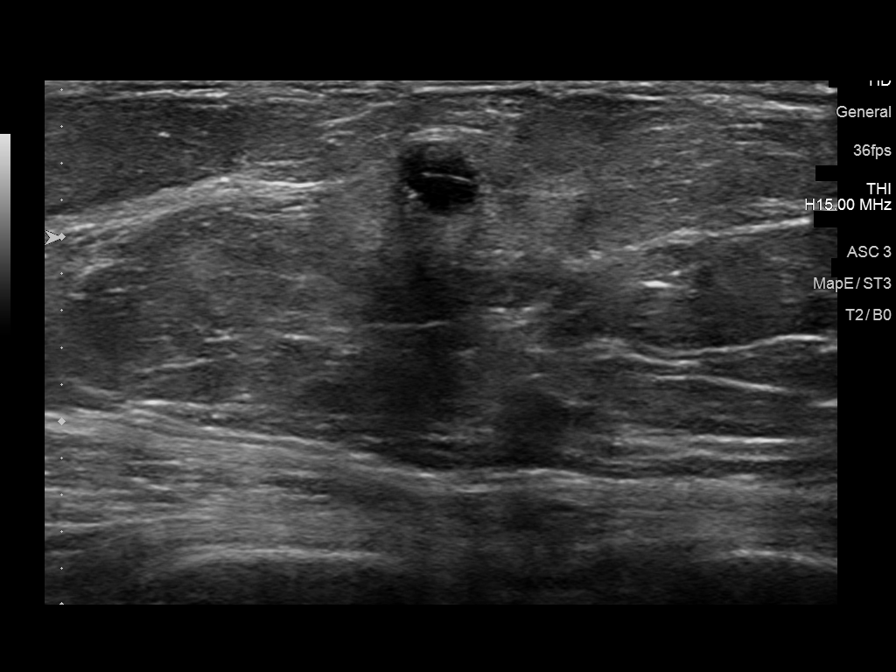
[im 5/8]
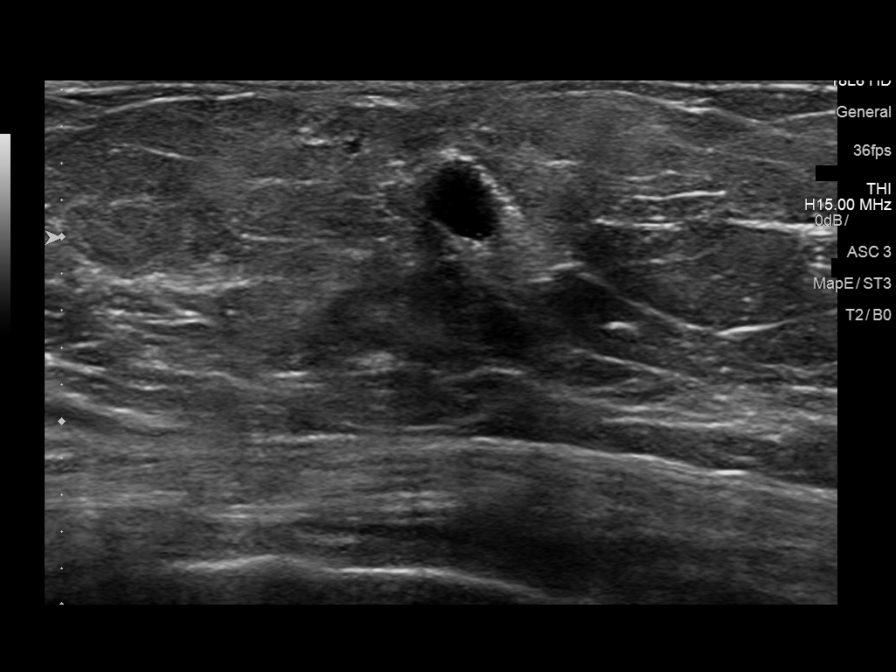
[im 6/8]
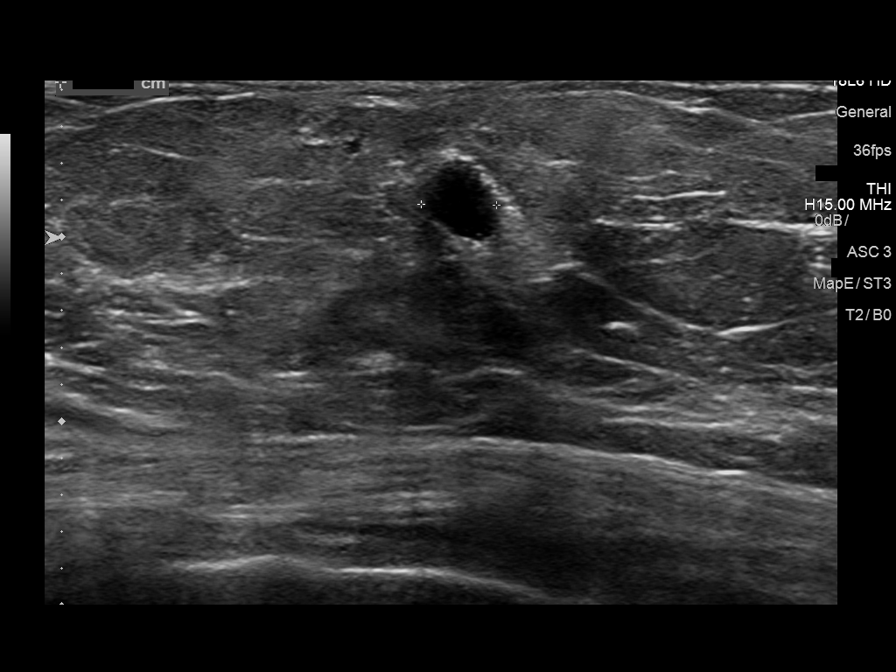
[im 7/8]
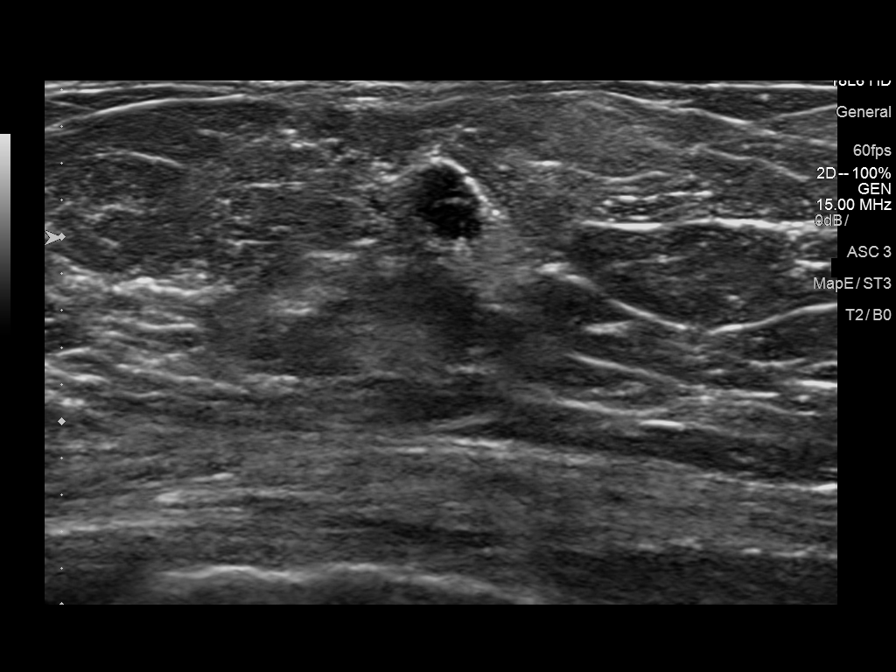
[im 8/8]
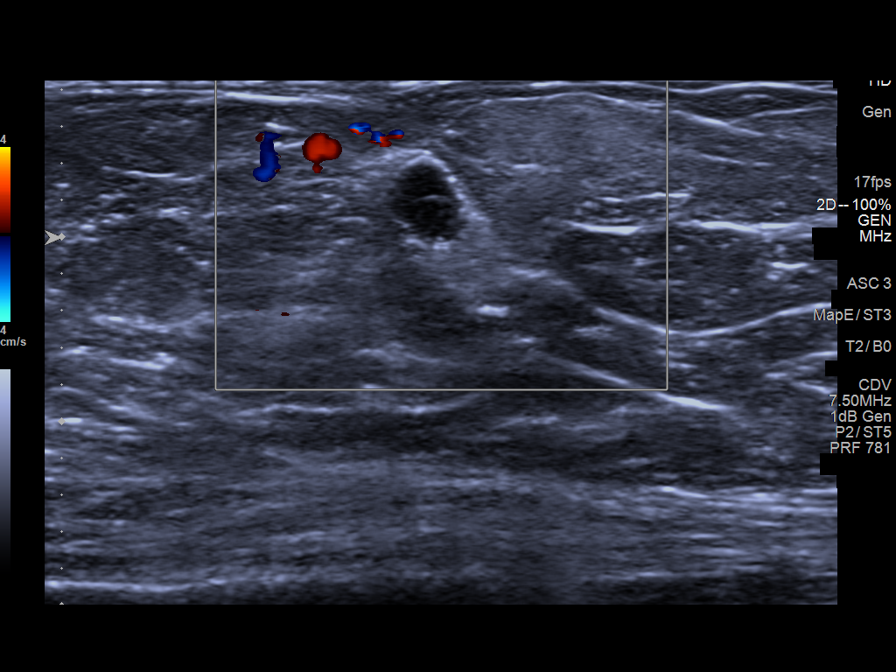

[8 of 8 positions shown; findings below may reference images not displayed]

FINDINGS: Spot compression CC and MLO tomograms were performed of the left
breast demonstrating 2 small masses in the upper slightly inner left
breast, 1 of which appears to contain a lucent center in demonstrate
imaging features suggestive of a oil cyst. These 2 masses measure
approximately 5-6 mm each.

Mammographic images were processed with CAD.

Targeted ultrasound of left breast was performed demonstrating an
oval circumscribed hypoechoic mass at 11 o'clock 8 cm from nipple
measuring 0.4 x 0.4 x 0.4 cm in likely representing an oil cyst.
This corresponds with the mass seen at mammography. Several small
surrounding hyperechoic areas are present in this region in the
upper left breast. Findings are most suggestive of posttraumatic
change.
IMPRESSION: Findings most consistent with posttraumatic change in the upper left
breast.

RECOMMENDATION:
Diagnostic mammography of the left breast in 6 months with possible
left breast ultrasound to ensure improvement/resolution of the
probably benign findings likely representing posttraumatic change in
the upper left breast.

I have discussed the findings and recommendations with the patient.
Results were also provided in writing at the conclusion of the
visit. If applicable, a reminder letter will be sent to the patient
regarding the next appointment.

BI-RADS CATEGORY  3: Probably benign.

## 2016-12-31 ENCOUNTER — Ambulatory Visit
Admission: RE | Admit: 2016-12-31 | Discharge: 2016-12-31 | Disposition: A | Discharge status: Home / Self Care | Payer: Medicare Other | Source: Ambulatory Visit | Attending: Internal Medicine | Admitting: Internal Medicine

## 2016-12-31 DIAGNOSIS — N6320 Unspecified lump in the left breast, unspecified quadrant: Secondary | ICD-10-CM

## 2016-12-31 DIAGNOSIS — R928 Other abnormal and inconclusive findings on diagnostic imaging of breast: Secondary | ICD-10-CM | POA: Insufficient documentation

## 2016-12-31 DIAGNOSIS — N6312 Unspecified lump in the right breast, upper inner quadrant: Secondary | ICD-10-CM | POA: Insufficient documentation

## 2017-11-29 ENCOUNTER — Other Ambulatory Visit: Payer: Self-pay | Admitting: Internal Medicine

## 2017-11-29 DIAGNOSIS — Z1231 Encounter for screening mammogram for malignant neoplasm of breast: Secondary | ICD-10-CM

## 2018-01-03 ENCOUNTER — Ambulatory Visit
Admission: RE | Admit: 2018-01-03 | Discharge: 2018-01-03 | Disposition: A | Discharge status: Home / Self Care | Payer: Medicare Other | Source: Ambulatory Visit | Attending: Internal Medicine | Admitting: Internal Medicine

## 2018-01-03 DIAGNOSIS — Z1231 Encounter for screening mammogram for malignant neoplasm of breast: Secondary | ICD-10-CM | POA: Diagnosis present

## 2018-02-25 IMAGING — MG MM DIGITAL SCREENING BILAT W/ TOMO W/ CAD
8 of 14 series · 8 of 30 positions shown · non-contrast
Comparison: Previous exam(s).

ACR Breast Density Category a: The breast tissue is almost entirely
fatty.

CLINICAL DATA: Screening.

EXAM:
2D DIGITAL SCREENING BILATERAL MAMMOGRAM WITH CAD AND ADJUNCT TOMO

[R MLO synth-2D]
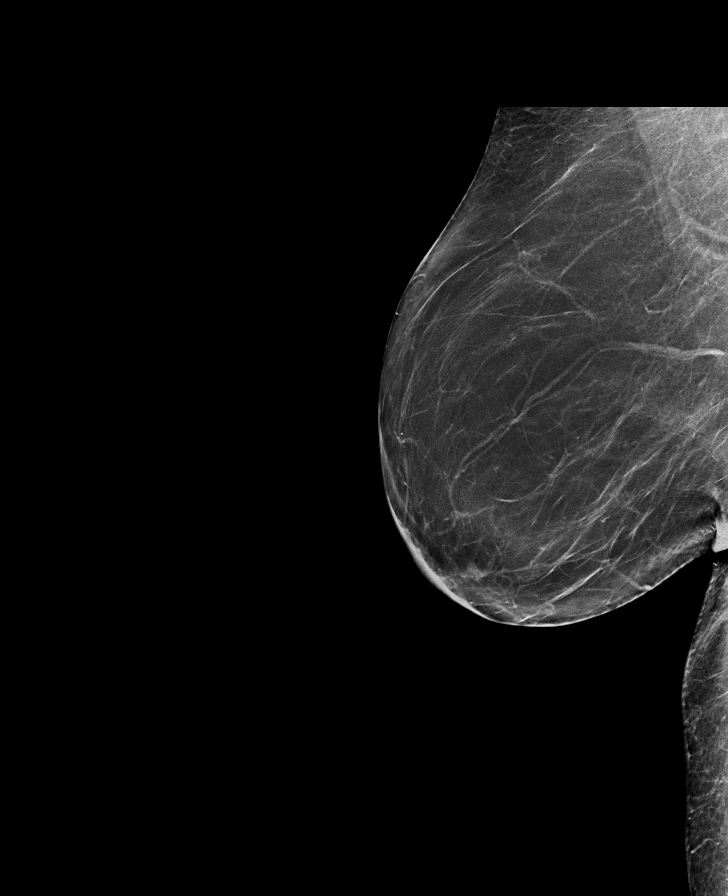

[R CC synth-2D]
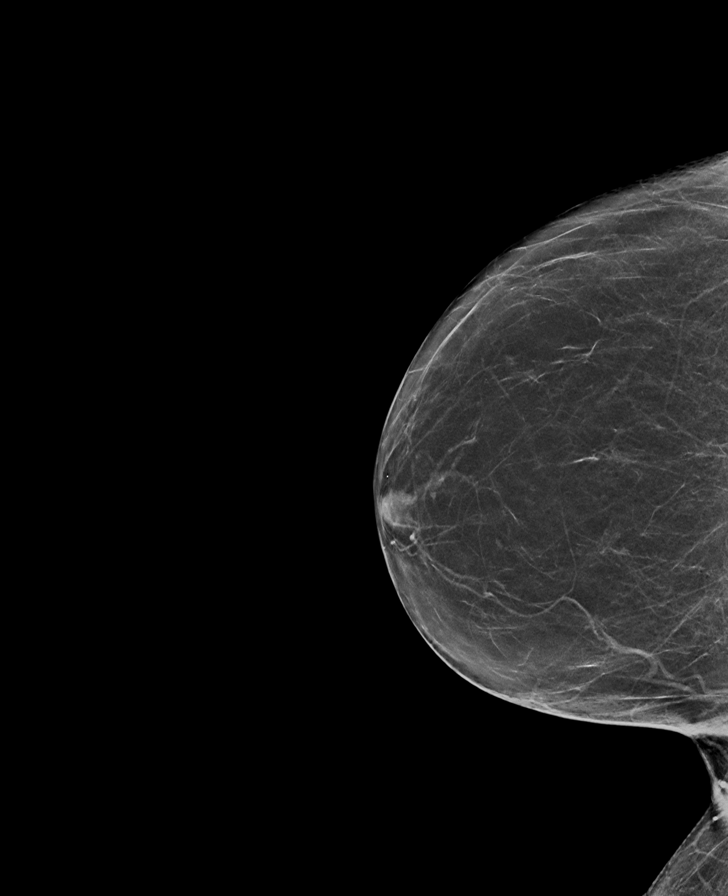

[L CC]
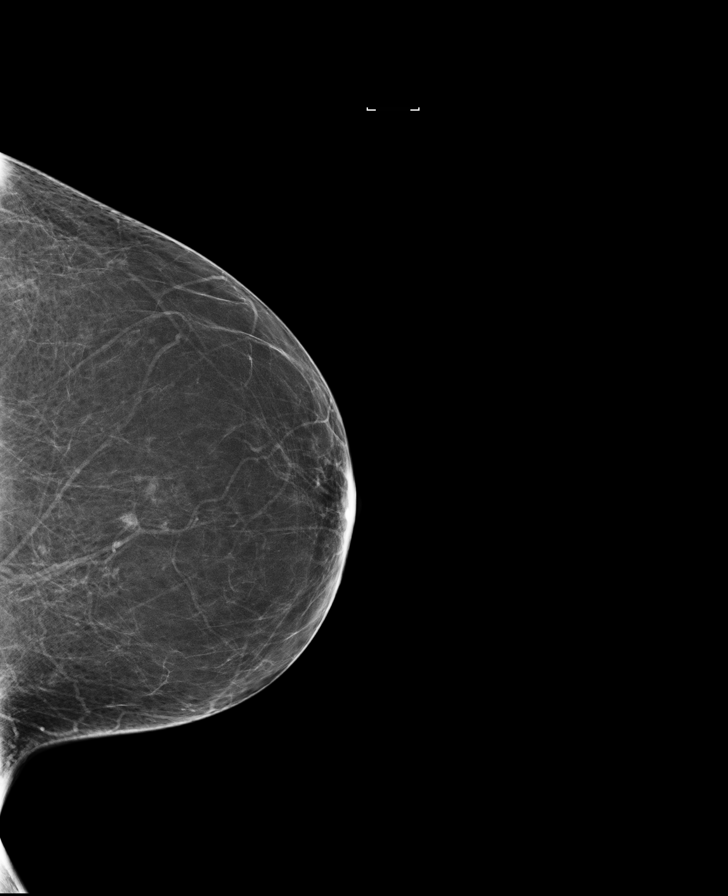

[R CC]
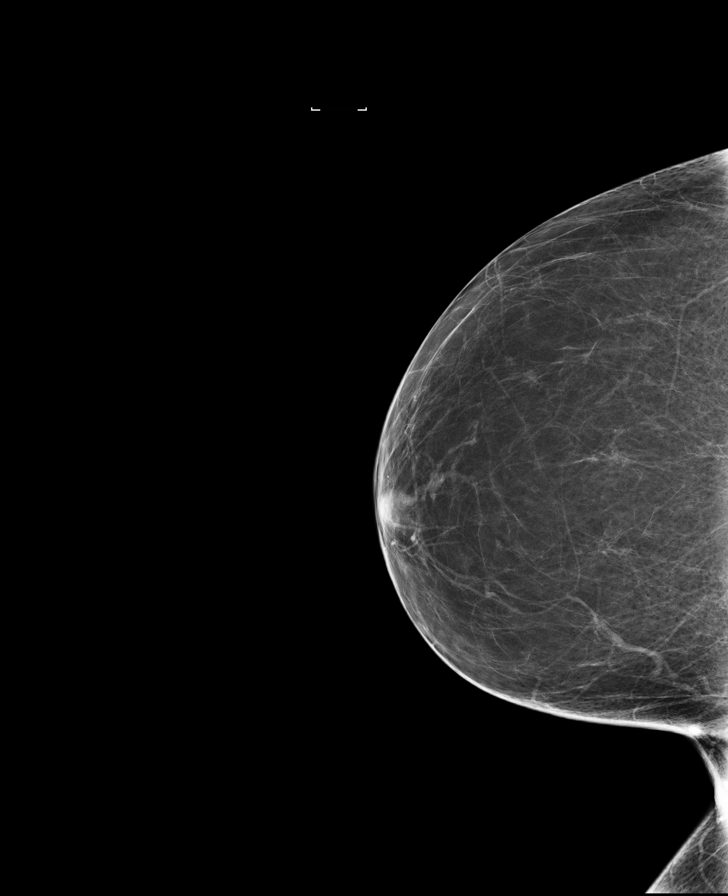

[L MLO synth-2D]
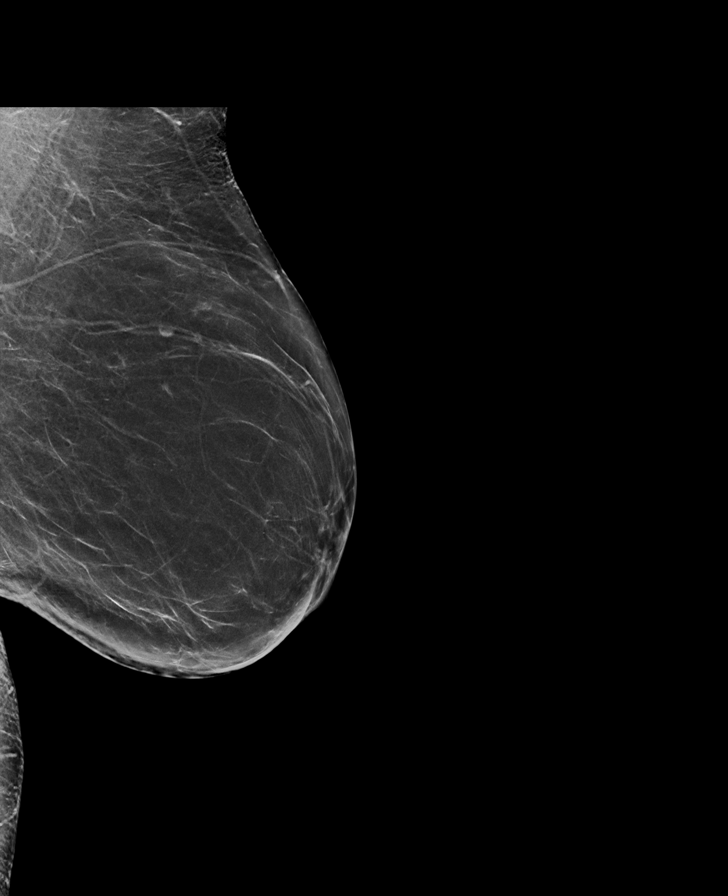

[R MLO]
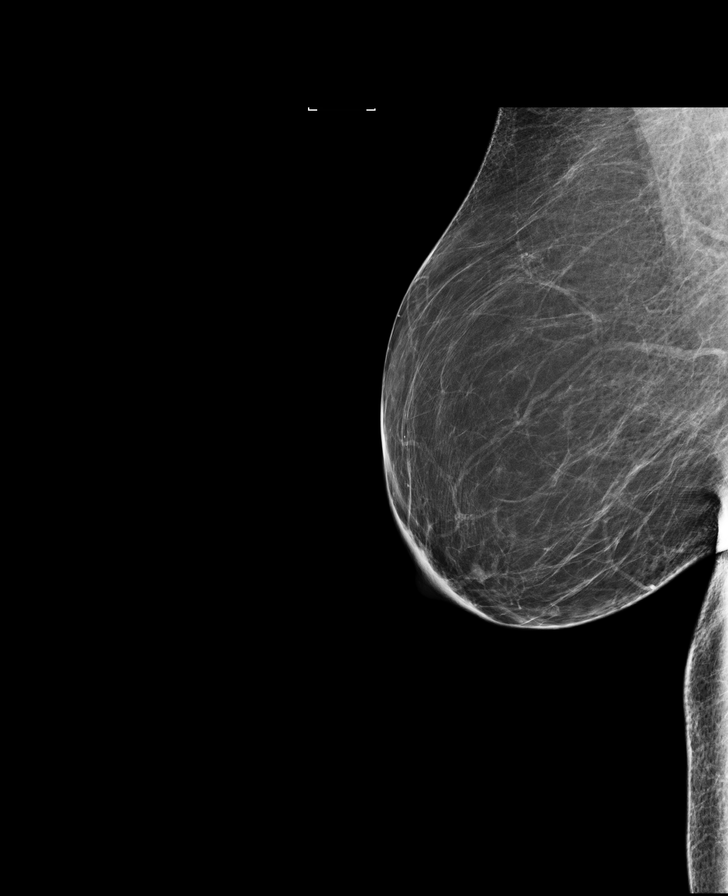

[L CC synth-2D]
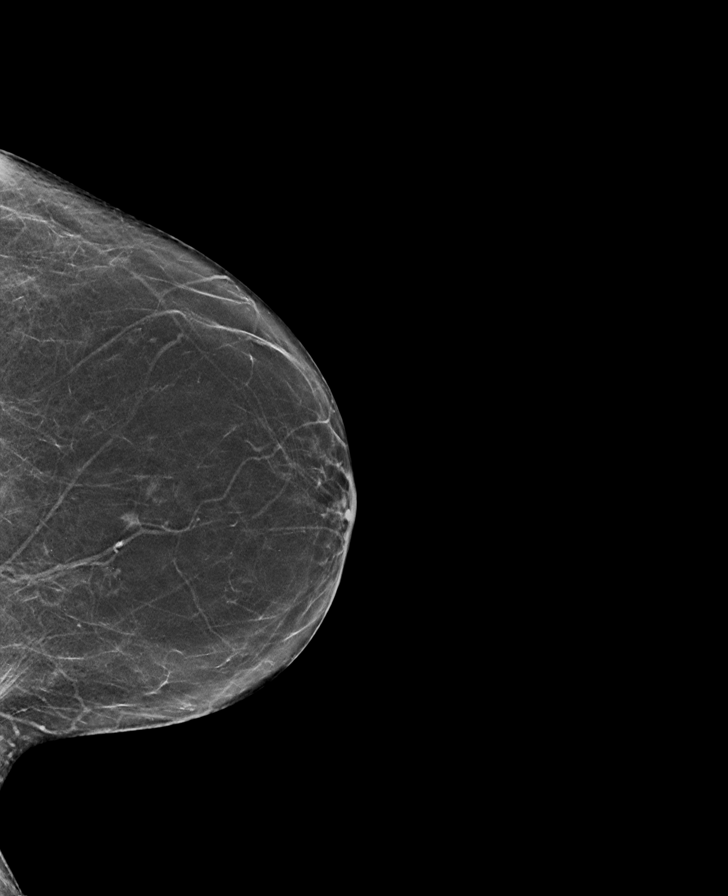

[L MLO]
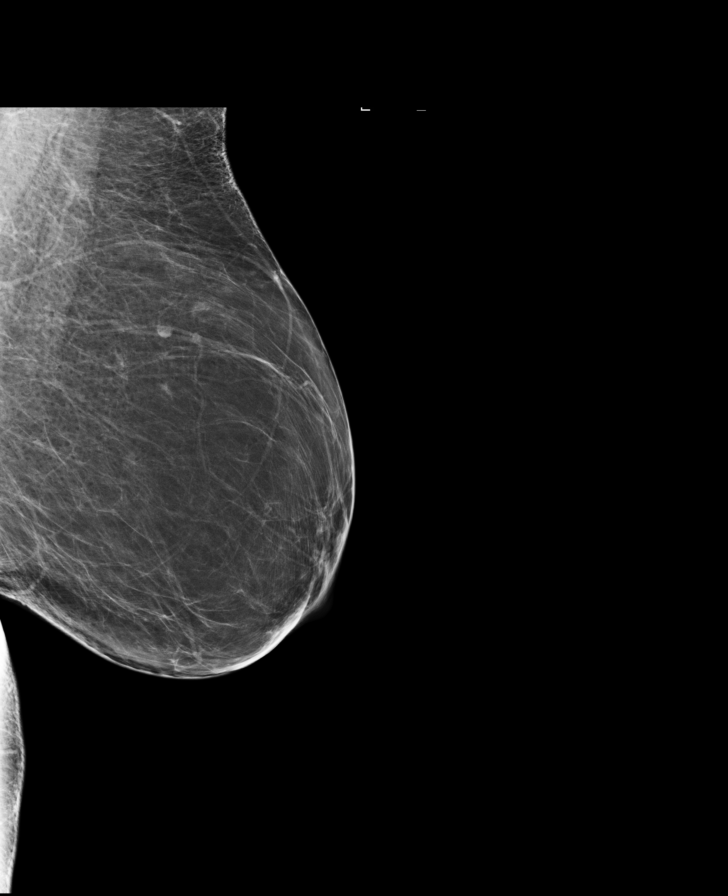

[8 of 30 positions shown; findings below may reference images not displayed]

FINDINGS: In the left breast, a possible mass warrants further evaluation.
This possible mass is seen within the upper left breast, 12 o'clock
axis region, at middle to posterior depth, tomosynthesis CC slice 37
and MLO slice 46.

In the right breast, no findings suspicious for malignancy.

Images were processed with CAD.
IMPRESSION: Further evaluation is suggested for possible mass in the left
breast.

Per the technologist, patient claims a history of previous left
breast trauma which may be associated.

RECOMMENDATION:
Diagnostic mammogram and possibly ultrasound of the left breast.
(Code:[id number]-2-88F)

The patient will be contacted regarding the findings, and additional
imaging will be scheduled.

BI-RADS CATEGORY  0: Incomplete. Need additional imaging evaluation
and/or prior mammograms for comparison.

## 2018-12-19 ENCOUNTER — Other Ambulatory Visit: Payer: Self-pay | Admitting: Internal Medicine

## 2018-12-19 DIAGNOSIS — Z1231 Encounter for screening mammogram for malignant neoplasm of breast: Secondary | ICD-10-CM

## 2019-01-19 ENCOUNTER — Ambulatory Visit
Admission: RE | Admit: 2019-01-19 | Discharge: 2019-01-19 | Disposition: A | Discharge status: Home / Self Care | Payer: Medicare Other | Source: Ambulatory Visit | Attending: Internal Medicine | Admitting: Internal Medicine

## 2019-01-19 DIAGNOSIS — Z1231 Encounter for screening mammogram for malignant neoplasm of breast: Secondary | ICD-10-CM

## 2019-07-16 ENCOUNTER — Ambulatory Visit: Payer: Medicare Other | Attending: Internal Medicine

## 2019-07-16 DIAGNOSIS — Z23 Encounter for immunization: Secondary | ICD-10-CM | POA: Insufficient documentation

## 2019-07-16 NOTE — Progress Notes (Signed)
   Covid-19 Vaccination Clinic  Name:  Tracy Haas    MRN: [id number] DOB: December 28, 1945  07/16/2019  [name] was observed post Covid-19 immunization for 15 minutes without incidence. She was provided with Vaccine Information Sheet and instruction to access the V-Safe system.   [name] was instructed to call 911 with any severe reactions post vaccine: Marland Kitchen Difficulty breathing  . Swelling of your face and throat  . A fast heartbeat  . A bad rash all over your body  . Dizziness and weakness    Immunizations Administered    Name Date Dose VIS Date Route   Pfizer COVID-19 Vaccine 07/16/2019  1:08 PM 0.3 mL 04/28/2019 Intramuscular   Manufacturer: [employer], [employer]   Lot: [id number]   NDC: [address]-8

## 2019-08-08 ENCOUNTER — Ambulatory Visit: Payer: Medicare Other | Attending: Internal Medicine

## 2019-08-08 DIAGNOSIS — Z23 Encounter for immunization: Secondary | ICD-10-CM

## 2019-08-08 NOTE — Progress Notes (Signed)
   Covid-19 Vaccination Clinic  Name:  TALEEYA BLONDIN    MRN: [id number] DOB: 1945-05-20  08/08/2019  Ms. Bar was observed post Covid-19 immunization for 15 minutes without incident. She was provided with Vaccine Information Sheet and instruction to access the V-Safe system.   Ms. Widmer was instructed to call 911 with any severe reactions post vaccine: Marland Kitchen Difficulty breathing  . Swelling of face and throat  . A fast heartbeat  . A bad rash all over body  . Dizziness and weakness   Immunizations Administered    Name Date Dose VIS Date Route   Pfizer COVID-19 Vaccine 08/08/2019  1:34 PM 0.3 mL 04/28/2019 Intramuscular   Manufacturer: [employer], [employer]   Lot: [id number]   NDC: [address]-3

## 2019-12-22 ENCOUNTER — Other Ambulatory Visit: Payer: Self-pay | Admitting: Internal Medicine

## 2020-03-13 ENCOUNTER — Other Ambulatory Visit: Payer: Self-pay | Admitting: Internal Medicine

## 2020-03-13 DIAGNOSIS — Z1231 Encounter for screening mammogram for malignant neoplasm of breast: Secondary | ICD-10-CM

## 2020-04-22 ENCOUNTER — Other Ambulatory Visit: Payer: Self-pay

## 2020-04-22 ENCOUNTER — Ambulatory Visit
Admission: RE | Admit: 2020-04-22 | Discharge: 2020-04-22 | Disposition: A | Discharge status: Home / Self Care | Payer: Medicare Other | Source: Ambulatory Visit | Attending: Internal Medicine | Admitting: Internal Medicine

## 2020-04-22 DIAGNOSIS — Z1231 Encounter for screening mammogram for malignant neoplasm of breast: Secondary | ICD-10-CM

## 2020-04-29 ENCOUNTER — Other Ambulatory Visit: Payer: Self-pay | Admitting: Internal Medicine

## 2020-04-29 DIAGNOSIS — N6310 Unspecified lump in the right breast, unspecified quadrant: Secondary | ICD-10-CM

## 2020-04-29 DIAGNOSIS — R928 Other abnormal and inconclusive findings on diagnostic imaging of breast: Secondary | ICD-10-CM

## 2020-05-06 ENCOUNTER — Other Ambulatory Visit: Payer: Self-pay

## 2020-05-06 ENCOUNTER — Ambulatory Visit
Admission: RE | Admit: 2020-05-06 | Discharge: 2020-05-06 | Disposition: A | Discharge status: Home / Self Care | Payer: Medicare Other | Source: Ambulatory Visit | Attending: Internal Medicine | Admitting: Internal Medicine

## 2020-05-06 DIAGNOSIS — N6310 Unspecified lump in the right breast, unspecified quadrant: Secondary | ICD-10-CM

## 2020-05-06 DIAGNOSIS — R928 Other abnormal and inconclusive findings on diagnostic imaging of breast: Secondary | ICD-10-CM

## 2020-05-20 ENCOUNTER — Ambulatory Visit: Payer: Medicare Other

## 2020-05-27 ENCOUNTER — Ambulatory Visit: Payer: Medicare Other

## 2020-06-08 ENCOUNTER — Encounter: Payer: Self-pay | Admitting: Emergency Medicine

## 2020-06-08 ENCOUNTER — Other Ambulatory Visit: Payer: Self-pay

## 2020-06-08 DIAGNOSIS — Z79899 Other long term (current) drug therapy: Secondary | ICD-10-CM | POA: Diagnosis not present

## 2020-06-08 DIAGNOSIS — E039 Hypothyroidism, unspecified: Secondary | ICD-10-CM | POA: Insufficient documentation

## 2020-06-08 DIAGNOSIS — R440 Auditory hallucinations: Secondary | ICD-10-CM | POA: Diagnosis not present

## 2020-06-08 DIAGNOSIS — I10 Essential (primary) hypertension: Secondary | ICD-10-CM | POA: Diagnosis not present

## 2020-06-08 DIAGNOSIS — R413 Other amnesia: Secondary | ICD-10-CM | POA: Diagnosis present

## 2020-06-08 DIAGNOSIS — F0391 Unspecified dementia with behavioral disturbance: Secondary | ICD-10-CM | POA: Insufficient documentation

## 2020-06-08 DIAGNOSIS — N3001 Acute cystitis with hematuria: Secondary | ICD-10-CM | POA: Diagnosis not present

## 2020-06-08 LAB — COMPREHENSIVE METABOLIC PANEL
ALT: 22 U/L (ref 0–44)
AST: 19 U/L (ref 15–41)
Albumin: 3.8 g/dL (ref 3.5–5.0)
Alkaline Phosphatase: 67 U/L (ref 38–126)
Anion gap: 12 (ref 5–15)
BUN: 29 mg/dL — ABNORMAL HIGH (ref 8–23)
CO2: 24 mmol/L (ref 22–32)
Calcium: 9.5 mg/dL (ref 8.9–10.3)
Chloride: 103 mmol/L (ref 98–111)
Creatinine, Ser: 1.26 mg/dL — ABNORMAL HIGH (ref 0.44–1.00)
GFR, Estimated: 45 mL/min — ABNORMAL LOW (ref >60)
Glucose, Bld: 169 mg/dL — ABNORMAL HIGH (ref 70–99)
Potassium: 3 mmol/L — ABNORMAL LOW (ref 3.5–5.1)
Sodium: 139 mmol/L (ref 135–145)
Total Bilirubin: 2.7 mg/dL — ABNORMAL HIGH (ref 0.3–1.2)
Total Protein: 7.4 g/dL (ref 6.5–8.1)

## 2020-06-08 LAB — CBC
HCT: 42 % (ref 36.0–46.0)
Hemoglobin: 14 g/dL (ref 12.0–15.0)
MCH: 31.5 pg (ref 26.0–34.0)
MCHC: 33.3 g/dL (ref 30.0–36.0)
MCV: 94.4 fL (ref 80.0–100.0)
Platelets: 226 10*3/uL (ref 150–400)
RBC: 4.45 MIL/uL (ref 3.87–5.11)
RDW: 12 % (ref 11.5–15.5)
WBC: 10 10*3/uL (ref 4.0–10.5)
nRBC: 0 % (ref 0.0–0.2)

## 2020-06-08 LAB — URINALYSIS, COMPLETE (UACMP) WITH MICROSCOPIC
Bilirubin Urine: NEGATIVE
Glucose, UA: 50 mg/dL — AB
Ketones, ur: 5 mg/dL — AB
Nitrite: NEGATIVE
Protein, ur: 100 mg/dL — AB
Specific Gravity, Urine: 1.018 (ref 1.005–1.030)
pH: 5 (ref 5.0–8.0)

## 2020-06-08 NOTE — ED Triage Notes (Signed)
Patient brought in with son. Patient has been auditory hallucinations times three weeks. Patient's pcp thought she had a UTI and was given antibiotics. Patient's son states that she completed the antibiotics 2 days ago. Patient's son states that she was thinking clear again but tonight about 19:00 she thought she heard someone in her house and called 911. Patient states that she started feeling nauseated today. Patient with equal grips and no drift noted at this time.

## 2020-06-09 ENCOUNTER — Emergency Department: Payer: Medicare Other

## 2020-06-09 ENCOUNTER — Emergency Department
Admission: EM | Admit: 2020-06-09 | Discharge: 2020-06-09 | Disposition: A | Discharge status: Home / Self Care | Payer: Medicare Other | Attending: Emergency Medicine | Admitting: Emergency Medicine

## 2020-06-09 DIAGNOSIS — F0391 Unspecified dementia with behavioral disturbance: Secondary | ICD-10-CM

## 2020-06-09 DIAGNOSIS — N3001 Acute cystitis with hematuria: Secondary | ICD-10-CM

## 2020-06-09 MED ORDER — CEPHALEXIN 500 MG PO CAPS: 500.0000 mg | ORAL_CAPSULE | Freq: Two times a day (BID) | ORAL | 0 refills | Status: DC

## 2020-06-09 MED ORDER — LORAZEPAM 0.5 MG PO TABS: 0.5000 mg | ORAL_TABLET | Freq: Two times a day (BID) | ORAL | 0 refills | Status: AC | PRN

## 2020-06-09 NOTE — ED Provider Notes (Signed)
[facility] Emergency Department Provider Note   ____________________________________________    I have reviewed the triage vital signs and the nursing notes.   HISTORY  Chief Complaint Hallucinations     HPI Tracy Haas is a 75 y.o. female with history as below who presents with son who is providing history.  He reports auditory hallucinations which seem to be worsening gradually over the last 1.5 months.  He notes increased forgetfulness as well.  Was started on antibiotics which did seem to briefly help however when antibiotics stopped it seemed her hallucinations worsened again.  No neuro deficits reported, no headache,  Past Medical History:  Diagnosis Date  . Adult hypothyroidism 11/05/2013  . Anxiety   . B12 deficiency 11/07/2013  . BP (high blood pressure) 02/11/2012  . Diverticulitis   . Family history of adverse reaction to anesthesia    mother- possible problems with intubation.  . Fatty infiltration of liver 04/18/2012   Overview:  [place] finding 03/2012   . GERD (gastroesophageal reflux disease)   . Hematuria 01/22/2012  . High cholesterol   . HLD (hyperlipidemia) 10/14/2012  . Hypertension   . Hypothyroidism   . Panic attacks   . Stenosis of intestine [facility])     Patient Active Problem List   Diagnosis Date Noted  . Trigger middle finger of right hand 03/20/2016  . Diverticulitis of colon   . Stenosis of intestine (HCC)   . Diverticulitis 03/01/2015  . Diverticulitis of large intestine without perforation or abscess without bleeding   . B12 deficiency 11/07/2013  . Adult hypothyroidism 11/05/2013  . HLD (hyperlipidemia) 10/14/2012  . Fatty infiltration of liver 04/18/2012  . BP (high blood pressure) 02/11/2012  . Hematuria 01/22/2012    Past Surgical History:  Procedure Laterality Date  . ABDOMINAL HYSTERECTOMY  1989  . BREAST BIOPSY Right 1990's   neg  . COLON RESECTION SIGMOID N/A 07/24/2016   Procedure: COLON RESECTION  SIGMOID;  Surgeon: Ricarda Frame, MD;  Location: [facility] ORS;  Service: General;  Laterality: N/A;  . COLONOSCOPY WITH PROPOFOL N/A 04/16/2015   Procedure: COLONOSCOPY WITH PROPOFOL;  Surgeon: Midge Minium, MD;  Location: [facility] ENDOSCOPY;  Service: Endoscopy;  Laterality: N/A;  . CYSTOSCOPY WITH STENT PLACEMENT Bilateral 07/24/2016   Procedure: CYSTOSCOPY WITH STENT PLACEMENT;  Surgeon: Hildred Laser, MD;  Location: [facility] ORS;  Service: Urology;  Laterality: Bilateral;  . LAPAROSCOPIC SIGMOID COLECTOMY N/A 07/24/2016   Procedure: LAPAROSCOPIC SIGMOID COLECTOMY;  Surgeon: Ricarda Frame, MD;  Location: [facility] ORS;  Service: General;  Laterality: N/A;  . SMALL INTESTINE SURGERY  1989   Dr. Evette Cristal- (After Hysterectomy)  . TRIGGER FINGER RELEASE Right 04/15/2016   Procedure: RELEASE TRIGGER FINGER/A-1 PULLEY;  Surgeon: Christena Flake, MD;  Location: [facility];  Service: Orthopedics;  Laterality: Right;    Prior to Admission medications   Medication Sig Start Date End Date Taking? Authorizing Provider  cephALEXin (KEFLEX) 500 MG capsule Take 1 capsule (500 mg total) by mouth 2 (two) times daily. 06/09/20  Yes Jene Every, MD  LORazepam (ATIVAN) 0.5 MG tablet Take 1 tablet (0.5 mg total) by mouth every 12 (twelve) hours as needed for anxiety or sleep. 06/09/20 06/09/21 Yes Jene Every, MD  bisoprolol-hydrochlorothiazide [facility]) 5-6.25 MG tablet Take 1 tablet by mouth daily. 01/26/12   [provider]  Ca Phosphate-Cholecalciferol (CALCIUM 500 + D3) 250-500 MG-UNIT CHEW Chew 2 each by mouth daily.    [provider]  Cyanocobalamin (RA VITAMIN  B-12 TR) 1000 MCG TBCR Take 1 tablet by mouth daily.    [provider]  levothyroxine (SYNTHROID, LEVOTHROID) 75 MCG tablet Take 1 tablet by mouth daily before breakfast.  01/26/12   [provider]  omeprazole (PRILOSEC) 20 MG capsule Take 1 capsule by mouth daily. 12/17/14   [provider]  ramipril (ALTACE) 10  MG capsule Take 10 mg by mouth daily.  01/26/12   [provider]  simvastatin (ZOCOR) 20 MG tablet Take 20 mg by mouth daily. 01/26/12   [provider]     Allergies Patient has no known allergies.  Family History  Problem Relation Age of Onset  . Hypertension Mother   . Cirrhosis Mother   . Hypertension Father   . Diabetes Father   . Kidney failure Father   . Breast cancer Neg Hx     Social History Social History   Tobacco Use  . Smoking status: Never Smoker  . Smokeless tobacco: Never Used  Substance Use Topics  . Alcohol use: No  . Drug use: No    Review of Systems  Constitutional: No fever/chills Eyes: No visual changes.  ENT: No sore throat. Cardiovascular: Denies chest pain. Respiratory: Denies shortness of breath. Gastrointestinal: No abdominal pain.   Genitourinary: Negative for dysuria. Musculoskeletal: Negative for back pain. Skin: Negative for rash. Neurological: No focal deficits, no headache   ____________________________________________   PHYSICAL EXAM:  VITAL SIGNS: ED Triage Vitals  Enc Vitals Group     BP 06/08/20 2233 (!) 113/49     Pulse Rate 06/08/20 2233 86     Resp 06/08/20 2233 18     Temp 06/08/20 2233 98.1 F (36.7 C)     Temp Source 06/08/20 2233 Oral     SpO2 06/08/20 2233 97 %     Weight 06/08/20 2238 49.9 kg (110 lb)     Height 06/08/20 2238 1.524 m (5')     Head Circumference --      Peak Flow --      Pain Score 06/08/20 2238 0     Pain Loc --      Pain Edu? --      Excl. in GC? --     Constitutional: Alert, mildly disoriented no acute distress. Pleasant and interactive Eyes: Conjunctivae are normal.  PERRLA Head: Atraumatic. Nose: No congestion/rhinnorhea. Mouth/Throat: Mucous membranes are moist.    Cardiovascular: Normal rate, regular rhythm.   Good peripheral circulation. Respiratory: Normal respiratory effort.  No retractions Gastrointestinal: Soft and nontender. No distention.    Genitourinary: deferred Musculoskeletal:   Warm and well perfused Neurologic:  Normal speech and language. No gross focal neurologic deficits are appreciated.  Cranial nerves II to XII are normal, normal strength in all extremities Skin:  Skin is warm, dry and intact. No rash noted. Psychiatric: Mood and affect are normal. Speech and behavior are normal.  ____________________________________________   LABS (all labs ordered are listed, but only abnormal results are displayed)  Labs Reviewed  COMPREHENSIVE METABOLIC PANEL - Abnormal; Notable for the following components:      Result Value   Potassium 3.0 (*)    Glucose, Bld 169 (*)    BUN 29 (*)    Creatinine, Ser 1.26 (*)    Total Bilirubin 2.7 (*)    GFR, Estimated 45 (*)    All other components within normal limits  URINALYSIS, COMPLETE (UACMP) WITH MICROSCOPIC - Abnormal; Notable for the following components:   Color, Urine AMBER (*)  APPearance CLOUDY (*)    Glucose, UA 50 (*)    Hgb urine dipstick MODERATE (*)    Ketones, ur 5 (*)    Protein, ur 100 (*)    Leukocytes,Ua TRACE (*)    Bacteria, UA RARE (*)    All other components within normal limits  CBC   ____________________________________________  EKG ED ECG REPORT I, Jene Every, the attending physician, personally viewed and interpreted this ECG.  Date: 06/09/2020  Rhythm: normal sinus rhythm QRS Axis: normal Intervals: normal ST/T Wave abnormalities: normal Narrative Interpretation: no evidence of acute ischemia   ____________________________________________  RADIOLOGY CT head reviewed by me, unremarkable ____________________________________________   PROCEDURES  Procedure(s) performed: No  Procedures   Critical Care performed: No ____________________________________________   INITIAL IMPRESSION / ASSESSMENT AND PLAN / ED COURSE  Pertinent labs & imaging results that were available during my care of the patient were reviewed by me  and considered in my medical decision making (see chart for details).  Patient presents with worsening auditory hallucinations over nearly 1 to 2 months, forgetfulness as well.  No aggressive behavior.  Possible improvement with antibiotics recently prescribed over the phone.  Presentation suspicious for early onset dementia given auditory hallucinations, unusual forgetful behavior.  CT scan here is reassuring, no neurodeficits to suggest CVA.  Trace leukocytes on urinalysis possibility of treatment failure for UTI, will add Keflex,  Ativan as needed for son to help patient's sleep at night.  Outpatient neurology evaluation for dementia further evaluation    ____________________________________________   FINAL CLINICAL IMPRESSION(S) / ED DIAGNOSES  Final diagnoses:  Dementia with behavioral disturbance, unspecified dementia type (HCC)  Acute cystitis with hematuria        Note:  This document was prepared using Dragon voice recognition software and may include unintentional dictation errors.   Jene Every, MD 06/09/20 1005

## 2021-04-01 ENCOUNTER — Other Ambulatory Visit: Payer: Self-pay | Admitting: Internal Medicine

## 2021-04-01 DIAGNOSIS — Z1231 Encounter for screening mammogram for malignant neoplasm of breast: Secondary | ICD-10-CM

## 2023-02-19 ENCOUNTER — Ambulatory Visit
Admission: RE | Admit: 2023-02-19 | Discharge: 2023-02-19 | Disposition: A | Discharge status: Home / Self Care | Payer: Medicare Other | Source: Ambulatory Visit | Attending: Internal Medicine | Admitting: Internal Medicine

## 2023-02-19 ENCOUNTER — Other Ambulatory Visit: Payer: Self-pay | Admitting: Internal Medicine

## 2023-02-19 DIAGNOSIS — R1084 Generalized abdominal pain: Secondary | ICD-10-CM | POA: Diagnosis present

## 2023-02-19 DIAGNOSIS — Z800 Family history of malignant neoplasm of digestive organs: Secondary | ICD-10-CM

## 2023-02-19 DIAGNOSIS — I5021 Acute systolic (congestive) heart failure: Secondary | ICD-10-CM

## 2023-02-19 MED ORDER — IOHEXOL 300 MG/ML  SOLN
80.0000 mL | Freq: Once | INTRAMUSCULAR | Status: AC | PRN
  Administered 2023-02-19: 80 mL via INTRAVENOUS

## 2024-04-08 ENCOUNTER — Inpatient Hospital Stay
Admission: EM | Admit: 2024-04-08 | Discharge: 2024-04-16 | DRG: 391 | Disposition: A | Discharge status: Home Health | Attending: Family Medicine | Admitting: Family Medicine

## 2024-04-08 ENCOUNTER — Emergency Department

## 2024-04-08 ENCOUNTER — Other Ambulatory Visit: Payer: Self-pay

## 2024-04-08 DIAGNOSIS — N390 Urinary tract infection, site not specified: Secondary | ICD-10-CM

## 2024-04-08 DIAGNOSIS — E7800 Pure hypercholesterolemia, unspecified: Secondary | ICD-10-CM | POA: Diagnosis present

## 2024-04-08 DIAGNOSIS — Z7901 Long term (current) use of anticoagulants: Secondary | ICD-10-CM

## 2024-04-08 DIAGNOSIS — D638 Anemia in other chronic diseases classified elsewhere: Secondary | ICD-10-CM | POA: Diagnosis present

## 2024-04-08 DIAGNOSIS — I5A Non-ischemic myocardial injury (non-traumatic): Secondary | ICD-10-CM | POA: Diagnosis present

## 2024-04-08 DIAGNOSIS — I5032 Chronic diastolic (congestive) heart failure: Secondary | ICD-10-CM | POA: Diagnosis present

## 2024-04-08 DIAGNOSIS — I2489 Other forms of acute ischemic heart disease: Secondary | ICD-10-CM | POA: Diagnosis present

## 2024-04-08 DIAGNOSIS — Z751 Person awaiting admission to adequate facility elsewhere: Secondary | ICD-10-CM

## 2024-04-08 DIAGNOSIS — W19XXXA Unspecified fall, initial encounter: Secondary | ICD-10-CM | POA: Diagnosis present

## 2024-04-08 DIAGNOSIS — I480 Paroxysmal atrial fibrillation: Secondary | ICD-10-CM | POA: Diagnosis present

## 2024-04-08 DIAGNOSIS — Z9181 History of falling: Secondary | ICD-10-CM

## 2024-04-08 DIAGNOSIS — Z8419 Family history of other disorders of kidney and ureter: Secondary | ICD-10-CM

## 2024-04-08 DIAGNOSIS — Z79899 Other long term (current) drug therapy: Secondary | ICD-10-CM

## 2024-04-08 DIAGNOSIS — K5289 Other specified noninfective gastroenteritis and colitis: Secondary | ICD-10-CM | POA: Diagnosis present

## 2024-04-08 DIAGNOSIS — N3000 Acute cystitis without hematuria: Secondary | ICD-10-CM | POA: Diagnosis present

## 2024-04-08 DIAGNOSIS — R748 Abnormal levels of other serum enzymes: Secondary | ICD-10-CM

## 2024-04-08 DIAGNOSIS — K529 Noninfective gastroenteritis and colitis, unspecified: Secondary | ICD-10-CM

## 2024-04-08 DIAGNOSIS — I071 Rheumatic tricuspid insufficiency: Secondary | ICD-10-CM | POA: Diagnosis present

## 2024-04-08 DIAGNOSIS — E8729 Other acidosis: Secondary | ICD-10-CM

## 2024-04-08 DIAGNOSIS — I110 Hypertensive heart disease with heart failure: Secondary | ICD-10-CM | POA: Diagnosis present

## 2024-04-08 DIAGNOSIS — I10 Essential (primary) hypertension: Secondary | ICD-10-CM | POA: Diagnosis present

## 2024-04-08 DIAGNOSIS — Z79890 Hormone replacement therapy (postmenopausal): Secondary | ICD-10-CM

## 2024-04-08 DIAGNOSIS — N179 Acute kidney failure, unspecified: Principal | ICD-10-CM | POA: Diagnosis present

## 2024-04-08 DIAGNOSIS — E8721 Acute metabolic acidosis: Secondary | ICD-10-CM | POA: Diagnosis present

## 2024-04-08 DIAGNOSIS — N170 Acute kidney failure with tubular necrosis: Principal | ICD-10-CM | POA: Diagnosis present

## 2024-04-08 DIAGNOSIS — B9620 Unspecified Escherichia coli [E. coli] as the cause of diseases classified elsewhere: Secondary | ICD-10-CM | POA: Diagnosis present

## 2024-04-08 DIAGNOSIS — Z90710 Acquired absence of both cervix and uterus: Secondary | ICD-10-CM

## 2024-04-08 DIAGNOSIS — I493 Ventricular premature depolarization: Secondary | ICD-10-CM | POA: Diagnosis present

## 2024-04-08 DIAGNOSIS — E876 Hypokalemia: Secondary | ICD-10-CM | POA: Diagnosis present

## 2024-04-08 DIAGNOSIS — Z8249 Family history of ischemic heart disease and other diseases of the circulatory system: Secondary | ICD-10-CM

## 2024-04-08 DIAGNOSIS — R9431 Abnormal electrocardiogram [ECG] [EKG]: Secondary | ICD-10-CM

## 2024-04-08 DIAGNOSIS — R531 Weakness: Secondary | ICD-10-CM

## 2024-04-08 DIAGNOSIS — Z833 Family history of diabetes mellitus: Secondary | ICD-10-CM

## 2024-04-08 DIAGNOSIS — E1169 Type 2 diabetes mellitus with other specified complication: Secondary | ICD-10-CM | POA: Diagnosis present

## 2024-04-08 DIAGNOSIS — E860 Dehydration: Secondary | ICD-10-CM | POA: Diagnosis present

## 2024-04-08 LAB — MAGNESIUM: Magnesium: 1.8 mg/dL (ref 1.7–2.4)

## 2024-04-08 LAB — COMPREHENSIVE METABOLIC PANEL WITH GFR
ALT: 6 U/L (ref 0–44)
AST: <10 U/L — ABNORMAL LOW (ref 15–41)
Albumin: 3.7 g/dL (ref 3.5–5.0)
Alkaline Phosphatase: 80 U/L (ref 38–126)
Anion gap: 19 — ABNORMAL HIGH (ref 5–15)
BUN: 103 mg/dL — ABNORMAL HIGH (ref 8–23)
CO2: 10 mmol/L — ABNORMAL LOW (ref 22–32)
Calcium: 9.3 mg/dL (ref 8.9–10.3)
Chloride: 107 mmol/L (ref 98–111)
Creatinine, Ser: 2.89 mg/dL — ABNORMAL HIGH (ref 0.44–1.00)
GFR, Estimated: 16 mL/min — ABNORMAL LOW (ref >60)
Glucose, Bld: 157 mg/dL — ABNORMAL HIGH (ref 70–99)
Potassium: 3.9 mmol/L (ref 3.5–5.1)
Sodium: 135 mmol/L (ref 135–145)
Total Bilirubin: 0.7 mg/dL (ref 0.0–1.2)
Total Protein: 6.6 g/dL (ref 6.5–8.1)

## 2024-04-08 LAB — CBC WITH DIFFERENTIAL/PLATELET
Abs Immature Granulocytes: 0.04 K/uL (ref 0.00–0.07)
Basophils Absolute: 0 K/uL (ref 0.0–0.1)
Basophils Relative: 0 %
Eosinophils Absolute: 0 K/uL (ref 0.0–0.5)
Eosinophils Relative: 0 %
HCT: 36.1 % (ref 36.0–46.0)
Hemoglobin: 12.8 g/dL (ref 12.0–15.0)
Immature Granulocytes: 0 %
Lymphocytes Relative: 6 %
Lymphs Abs: 0.6 K/uL — ABNORMAL LOW (ref 0.7–4.0)
MCH: 31.3 pg (ref 26.0–34.0)
MCHC: 35.5 g/dL (ref 30.0–36.0)
MCV: 88.3 fL (ref 80.0–100.0)
Monocytes Absolute: 0.8 K/uL (ref 0.1–1.0)
Monocytes Relative: 9 %
Neutro Abs: 7.9 K/uL — ABNORMAL HIGH (ref 1.7–7.7)
Neutrophils Relative %: 85 %
Platelets: 197 K/uL (ref 150–400)
RBC: 4.09 MIL/uL (ref 3.87–5.11)
RDW: 12.9 % (ref 11.5–15.5)
WBC: 9.4 K/uL (ref 4.0–10.5)
nRBC: 0 % (ref 0.0–0.2)

## 2024-04-08 LAB — URINALYSIS, ROUTINE W REFLEX MICROSCOPIC
Bilirubin Urine: NEGATIVE
Glucose, UA: NEGATIVE mg/dL
Ketones, ur: NEGATIVE mg/dL
Nitrite: POSITIVE — AB
Protein, ur: NEGATIVE mg/dL
Specific Gravity, Urine: 1.013 (ref 1.005–1.030)
pH: 5 (ref 5.0–8.0)

## 2024-04-08 LAB — TROPONIN T, HIGH SENSITIVITY
Troponin T High Sensitivity: 35 ng/L — ABNORMAL HIGH (ref 0–19)
Troponin T High Sensitivity: 35 ng/L — ABNORMAL HIGH (ref 0–19)

## 2024-04-08 LAB — LIPASE, BLOOD: Lipase: 264 U/L — ABNORMAL HIGH (ref 11–51)

## 2024-04-08 MED ORDER — ENOXAPARIN SODIUM 30 MG/0.3ML IJ SOSY: 30.0000 mg | PREFILLED_SYRINGE | INTRAMUSCULAR | Status: DC

## 2024-04-08 MED ORDER — SODIUM CHLORIDE 0.9 % IV SOLN
1.0000 g | INTRAVENOUS | Status: AC
  Administered 2024-04-08 – 2024-04-11 (×4): 1 g via INTRAVENOUS
  Filled 2024-04-08 (×4): qty 10

## 2024-04-08 MED ORDER — HYDROCODONE-ACETAMINOPHEN 5-325 MG PO TABS: 1.0000 | ORAL_TABLET | ORAL | Status: DC | PRN

## 2024-04-08 MED ORDER — ONDANSETRON HCL 4 MG/2ML IJ SOLN
4.0000 mg | Freq: Four times a day (QID) | INTRAMUSCULAR | Status: DC | PRN
  Administered 2024-04-10: 4 mg via INTRAVENOUS
  Filled 2024-04-08: qty 2

## 2024-04-08 MED ORDER — HEPARIN SODIUM (PORCINE) 5000 UNIT/ML IJ SOLN
5000.0000 [IU] | Freq: Three times a day (TID) | INTRAMUSCULAR | Status: DC
  Administered 2024-04-09 – 2024-04-10 (×4): 5000 [IU] via SUBCUTANEOUS
  Filled 2024-04-08 (×4): qty 1

## 2024-04-08 MED ORDER — ACETAMINOPHEN 650 MG RE SUPP: 650.0000 mg | Freq: Four times a day (QID) | RECTAL | Status: DC | PRN

## 2024-04-08 MED ORDER — SODIUM BICARBONATE 8.4 % IV SOLN
INTRAVENOUS | Status: AC
  Filled 2024-04-08: qty 1000
  Filled 2024-04-08: qty 150

## 2024-04-08 MED ORDER — LACTATED RINGERS IV BOLUS
1000.0000 mL | Freq: Once | INTRAVENOUS | Status: AC
  Administered 2024-04-08: 1000 mL via INTRAVENOUS

## 2024-04-08 MED ORDER — PANTOPRAZOLE SODIUM 40 MG IV SOLR
40.0000 mg | INTRAVENOUS | Status: DC
  Administered 2024-04-08: 40 mg via INTRAVENOUS
  Filled 2024-04-08: qty 10

## 2024-04-08 MED ORDER — SODIUM CHLORIDE 0.9 % IV SOLN: 1.0000 g | Freq: Once | INTRAVENOUS | Status: DC

## 2024-04-08 MED ORDER — ONDANSETRON HCL 4 MG PO TABS: 4.0000 mg | ORAL_TABLET | Freq: Four times a day (QID) | ORAL | Status: DC | PRN

## 2024-04-08 MED ORDER — ACETAMINOPHEN 325 MG PO TABS
650.0000 mg | ORAL_TABLET | Freq: Four times a day (QID) | ORAL | Status: DC | PRN
  Administered 2024-04-09 – 2024-04-15 (×4): 650 mg via ORAL
  Filled 2024-04-08 (×4): qty 2

## 2024-04-08 NOTE — ED Notes (Signed)
 Angie with CCMD advised pt is not showing on cardiac monitor. Pt was laying on her side and I had to reconnect LL lead. Called C[facility] spoke with Angie who confirms pt can now be seen on the cardiac monitor.

## 2024-04-08 NOTE — Assessment & Plan Note (Signed)
 High anion gap metabolic acidosis Creatinine 2.89 up from baseline of 0.9, bicarb 10 and anion gap 19 Likely secondary to fluid losses related to acute gastroenteritis, causing ATN due to poor perfusion IV hydration with bicarb Monitor renal function and avoid nephrotoxins

## 2024-04-08 NOTE — ED Notes (Signed)
 Ambulated pt to the bathroom. Urine sample collected.

## 2024-04-08 NOTE — Assessment & Plan Note (Signed)
 Will get GI panel Symptom control IV fluids, IV antiemetics, IV Protonix 

## 2024-04-08 NOTE — Assessment & Plan Note (Signed)
 Suspect reactive related to acute gastroenteritis CT abdomen and pelvis was nonacute

## 2024-04-08 NOTE — Assessment & Plan Note (Signed)
Had episodes in the past though with an ICD; followed closely with cardiology last admission. -Continuous cardiac monitoring  

## 2024-04-08 NOTE — Progress Notes (Signed)
 Anticoagulation monitoring(Lovenox ):  78 yo  female ordered Lovenox  30 mg Q24h    Filed Weights   04/08/24 1754  Weight: 49.6 kg (109 lb 4.8 oz)   BMI 22   Lab Results  Component Value Date   CREATININE 2.89 (H) 04/08/2024   CREATININE 1.26 (H) 06/08/2020   CREATININE 0.60 07/26/2016   Estimated Creatinine Clearance: 10.9 mL/min (A) (by C-G formula based on SCr of 2.89 mg/dL (H)). Hemoglobin & Hematocrit     Component Value Date/Time   HGB 12.8 04/08/2024 1803   HCT 36.1 04/08/2024 1803     Per Protocol for Patient with estCrcl < 15 ml/min and BMI < 30, will transition to Heparin  5000 units SQ Q8H.

## 2024-04-08 NOTE — Assessment & Plan Note (Addendum)
 Secondary to UTI, dehydration Expecting improvement with treatment Fall precautions Can consider PT consult

## 2024-04-08 NOTE — Assessment & Plan Note (Signed)
 Patient clinically dry BP somewhat soft at 127/48 Will hold bisoprolol  HCTZ, ramipril  tonight and resume as appropriate

## 2024-04-08 NOTE — Assessment & Plan Note (Signed)
 Rocephin   Follow cultures

## 2024-04-08 NOTE — H&P (Signed)
 History and Physical    Patient: Tracy Haas FMW:[id number] DOB: May 25, 1945 DOA: 04/08/2024 DOS: the patient was seen and examined on 04/08/2024 PCP: Cleotilde Oneil FALCON, MD  Patient coming from: Home  Chief Complaint:  Chief Complaint  Patient presents with   Fall    HPI: Tracy Haas is a 78 y.o. female with medical history significant for Hypertension, HFpEF with moderate AI and MR, being admitted with urinary tract infection and acute kidney injury in the setting of acute gastroenteritis.  Patient has been having nausea vomiting and diarrhea for the past week with worsening weakness.  On the day of arrival after going to the bathroom, due to protracted weakness, she lowered herself to the floor where her son found her several hours later.  She denied falling or losing consciousness. In the ED BP 127/48 and vitals WNL Labs notable for normal WBC and otherwise normal CBC Lipase 264 with normal LFTs Creatinine 2.89 up from baseline of 0.9 a month ago with metabolic acidosis with bicarb of 10 and anion gap of 19 Troponin 35 EKG showed possible A-fib at 95 with PVCs CT abdomen and pelvis nonacute UA consistent with UTI Patient treated with an LR bolus and Rocephin  Admission requested     Past Medical History:  Diagnosis Date   Adult hypothyroidism 11/05/2013   Anxiety    B12 deficiency 11/07/2013   BP (high blood pressure) 02/11/2012   Diverticulitis    Family history of adverse reaction to anesthesia    mother- possible problems with intubation.   Fatty infiltration of liver 04/18/2012   Overview:  US  finding 03/2012    GERD (gastroesophageal reflux disease)    Hematuria 01/22/2012   High cholesterol    HLD (hyperlipidemia) 10/14/2012   Hypertension    Hypothyroidism    Panic attacks    Stenosis of intestine (HCC)    Past Surgical History:  Procedure Laterality Date   ABDOMINAL HYSTERECTOMY  1989   BREAST BIOPSY Right 1990's   neg   COLON RESECTION SIGMOID N/A 07/24/2016    Procedure: COLON RESECTION SIGMOID;  Surgeon: Carlin Pastel, MD;  Location: [facility] ORS;  Service: General;  Laterality: N/A;   COLONOSCOPY WITH PROPOFOL  N/A 04/16/2015   Procedure: COLONOSCOPY WITH PROPOFOL ;  Surgeon: Rogelia Copping, MD;  Location: [facility] END[facility];  Service: Endoscopy;  Laterality: N/A;   CYSTOSCOPY WITH STENT PLACEMENT Bilateral 07/24/2016   Procedure: CYSTOSCOPY WITH STENT PLACEMENT;  Surgeon: Redell Lynwood Napoleon, MD;  Location: [facility] ORS;  Service: Urology;  Laterality: Bilateral;   LAPAROSCOPIC SIGMOID COLECTOMY N/A 07/24/2016   Procedure: LAPAROSCOPIC SIGMOID COLECTOMY;  Surgeon: Carlin Pastel, MD;  Location: [facility] ORS;  Service: General;  Laterality: N/A;   SMALL INTESTINE SURGERY  1989   Dr. Dellie- (After Hysterectomy)   TRIGGER FINGER RELEASE Right 04/15/2016   Procedure: RELEASE TRIGGER FINGER/A-1 PULLEY;  Surgeon: Norleen JINNY Maltos, MD;  Location: [facility];  Service: Orthopedics;  Laterality: Right;   Social History:  reports that she has never smoked. She has never used smokeless tobacco. She reports that she does not drink alcohol and does not use drugs.  No Known Allergies  Family History  Problem Relation Age of Onset   Hypertension Mother    Cirrhosis Mother    Hypertension Father    Diabetes Father    Kidney failure Father    Breast cancer Neg Hx     Prior to Admission medications   Medication Sig Start Date End Date Taking? Authorizing Provider  bisoprolol -hydrochlorothiazide  (  ZIAC ) 5-6.25 MG tablet Take 1 tablet by mouth daily. 01/26/12   [provider]  Ca Phosphate-Cholecalciferol (CALCIUM 500 + D3) 250-500 MG-UNIT CHEW Chew 2 each by mouth daily.    [provider]  cephALEXin  (KEFLEX ) 500 MG capsule Take 1 capsule (500 mg total) by mouth 2 (two) times daily. 06/09/20   Arlander Charleston, MD  Cyanocobalamin (RA VITAMIN B-12 TR) 1000 MCG TBCR Take 1 tablet by mouth daily.    [provider]  levothyroxine  (SYNTHROID ,  LEVOTHROID) 75 MCG tablet Take 1 tablet by mouth daily before breakfast.  01/26/12   [provider]  omeprazole (PRILOSEC) 20 MG capsule Take 1 capsule by mouth daily. 12/17/14   [provider]  ramipril  (ALTACE ) 10 MG capsule Take 10 mg by mouth daily.  01/26/12   [provider]  simvastatin  (ZOCOR ) 20 MG tablet Take 20 mg by mouth daily. 01/26/12   [provider]    Physical Exam: Vitals:   04/08/24 1754  BP: (!) 127/48  Pulse: 93  Resp: 20  Temp: 98 F (36.7 C)  TempSrc: Oral  SpO2: 100%  Weight: 49.6 kg  Height: 4' 11 (1.499 m)   Physical Exam  Labs on Admission: I have personally reviewed following labs and imaging studies  CBC: Recent Labs  Lab 04/08/24 1803  WBC 9.4  NEUTROABS 7.9*  HGB 12.8  HCT 36.1  MCV 88.3  PLT 197   Basic Metabolic Panel: Recent Labs  Lab 04/08/24 1803  NA 135  K 3.9  CL 107  CO2 10*  GLUCOSE 157*  BUN 103*  CREATININE 2.89*  CALCIUM 9.3  MG 1.8   GFR: Estimated Creatinine Clearance: 10.9 mL/min (A) (by C-G formula based on SCr of 2.89 mg/dL (H)). Liver Function Tests: Recent Labs  Lab 04/08/24 1803  AST <10*  ALT 6  ALKPHOS 80  BILITOT 0.7  PROT 6.6  ALBUMIN 3.7   Recent Labs  Lab 04/08/24 1803  LIPASE 264*   No results for input(s): AMMONIA in the last 168 hours. Coagulation Profile: No results for input(s): INR, PROTIME in the last 168 hours. Cardiac Enzymes: No results for input(s): CKTOTAL, CKMB, CKMBINDEX, TROPONINI in the last 168 hours. BNP (last 3 results) No results for input(s): PROBNP in the last 8760 hours. HbA1C: No results for input(s): HGBA1C in the last 72 hours. CBG: No results for input(s): GLUCAP in the last 168 hours. Lipid Profile: No results for input(s): CHOL, HDL, LDLCALC, TRIG, CHOLHDL, LDLDIRECT in the last 72 hours. Thyroid Function Tests: No results for input(s): TSH, T4TOTAL, FREET4, T3FREE, THYROIDAB  in the last 72 hours. Anemia Panel: No results for input(s): VITAMINB12, FOLATE, FERRITIN, TIBC, IRON, RETICCTPCT in the last 72 hours. Urine analysis:    Component Value Date/Time   COLORURINE YELLOW (A) 04/08/2024 2020   APPEARANCEUR HAZY (A) 04/08/2024 2020   LABSPEC 1.013 04/08/2024 2020   PHURINE 5.0 04/08/2024 2020   GLUCOSEU NEGATIVE 04/08/2024 2020   HGBUR MODERATE (A) 04/08/2024 2020   BILIRUBINUR NEGATIVE 04/08/2024 2020   KETONESUR NEGATIVE 04/08/2024 2020   PROTEINUR NEGATIVE 04/08/2024 2020   NITRITE POSITIVE (A) 04/08/2024 2020   LEUKOCYTESUR MODERATE (A) 04/08/2024 2020    Radiological Exams on Admission: CT ABDOMEN PELVIS WO CONTRAST Result Date: 04/08/2024 CLINICAL DATA:  Nausea/vomiting/diarrhea, found down EXAM: CT ABDOMEN AND PELVIS WITHOUT CONTRAST TECHNIQUE: Multidetector CT imaging of the abdomen and pelvis was performed following the standard protocol without IV contrast. RADIATION DOSE REDUCTION: This exam was  performed according to the departmental dose-optimization program which includes automated exposure control, adjustment of the mA and/or kV according to patient size and/or use of iterative reconstruction technique. COMPARISON:  02/19/2023 FINDINGS: Lower chest: No acute pleural or parenchymal lung disease. Hepatobiliary: Unremarkable unenhanced appearance of the liver and gallbladder. Pancreas: Unremarkable unenhanced appearance. Spleen: Unremarkable unenhanced appearance. Adrenals/Urinary Tract: No urinary tract calculi or obstructive uropathy within either kidney. The adrenals are unremarkable. Bladder is grossly normal. Stomach/Bowel: Postsurgical changes from prior small bowel resection and partial sigmoid colon resection. No bowel obstruction or ileus. No bowel wall thickening or inflammatory change. Vascular/Lymphatic: Aortic atherosclerosis. No enlarged abdominal or pelvic lymph nodes. Reproductive: Status post hysterectomy. No adnexal masses.  Other: No free fluid or free intraperitoneal gas. No abdominal wall hernia. Musculoskeletal: No acute or destructive bony abnormalities. Reconstructed images demonstrate no additional findings. IMPRESSION: 1. No acute intra-abdominal or intrapelvic process. 2.  Aortic Atherosclerosis (ICD10-I70.0). Electronically Signed   By: Ozell Daring M.D.   On: 04/08/2024 20:01   Data Reviewed for HPI: Relevant notes from primary care and specialist visits, past discharge summaries as available in EHR, including Care Everywhere. Prior diagnostic testing as pertinent to current admission diagnoses Updated medications and problem lists for reconciliation ED course, including vitals, labs, imaging, treatment and response to treatment Triage notes, nursing and pharmacy notes and ED provider's notes Notable results as noted above in HPI      Assessment and Plan: * AKI (acute kidney injury) High anion gap metabolic acidosis Creatinine 2.89 up from baseline of 0.9, bicarb 10 and anion gap 19 Likely secondary to fluid losses related to acute gastroenteritis, causing ATN due to poor perfusion IV hydration with bicarb Monitor renal function and avoid nephrotoxins  Urinary tract infection Rocephin  Follow cultures  Acute gastroenteritis Will get GI panel Symptom control IV fluids, IV antiemetics, IV Protonix   Possible A-fib on EKG, with PVCs Continuous cardiac monitoring  Elevated lipase Suspect reactive related to acute gastroenteritis CT abdomen and pelvis was nonacute  Generalized weakness Secondary to UTI, dehydration Expecting improvement with treatment Fall precautions Can consider PT consult  Chronic heart failure with preserved ejection fraction (HFpEF) (HCC) Patient clinically dry BP somewhat soft at 127/48 Will hold bisoprolol  HCTZ, ramipril  tonight and resume as appropriate  Essential hypertension Holding antihypertensives tonight due to soft BP    DVT prophylaxis:  Lovenox   Consults: none  Advance Care Planning:   Code Status: Prior   Family Communication: none  Disposition Plan: Back to previous home environment  Severity of Illness: The appropriate patient status for this patient is OBSERVATION. Observation status is judged to be reasonable and necessary in order to provide the required intensity of service to ensure the patient's safety. The patient's presenting symptoms, physical exam findings, and initial radiographic and laboratory data in the context of their medical condition is felt to place them at decreased risk for further clinical deterioration. Furthermore, it is anticipated that the patient will be medically stable for discharge from the hospital within 2 midnights of admission.   Author: Delayne LULLA Solian, MD 04/08/2024 10:21 PM  For on call review [email or web address].

## 2024-04-08 NOTE — Assessment & Plan Note (Signed)
 Holding antihypertensives tonight due to soft BP

## 2024-04-08 NOTE — ED Provider Notes (Signed)
 [facility] Provider Note    Event Date/Time   First MD Initiated Contact with Patient 04/08/24 1747     (approximate)   History   Fall   HPI  Tracy Haas is a 78 y.o. female who presents to the ED for evaluation of Fall   Review of PCP visit from last month.  History of HTN, HLD, DM, diastolic CHF.  Patient presents from home after 1 week of nausea, vomiting and diarrhea.  Son has been trying to help out and found her on the floor of the bathroom today.  She denies any syncope or significant trauma but reports feeling so weak she had to lay down after another episode of emesis.   Physical Exam   Triage Vital Signs: ED Triage Vitals  Encounter Vitals Group     BP      Girls Systolic BP Percentile      Girls Diastolic BP Percentile      Boys Systolic BP Percentile      Boys Diastolic BP Percentile      Pulse      Resp      Temp      Temp src      SpO2      Weight      Height      Head Circumference      Peak Flow      Pain Score      Pain Loc      Pain Education      Exclude from Growth Chart     Most recent vital signs: Vitals:   04/08/24 2240 04/08/24 2341  BP: (!) 134/96 (!) 101/54  Pulse: (!) 30 66  Resp: (!) 27 17  Temp: 98.2 F (36.8 C) 98.2 F (36.8 C)  SpO2: 100% 100%    General: Awake, no distress.  CV:  Good peripheral perfusion.  Resp:  Normal effort.  Abd:  No distention.  Diffuse and mild lower abdominal tenderness MSK:  No deformity noted.  Neuro:  No focal deficits appreciated. Other:     ED Results / Procedures / Treatments   Labs (all labs ordered are listed, but only abnormal results are displayed) Labs Reviewed  COMPREHENSIVE METABOLIC PANEL WITH GFR - Abnormal; Notable for the following components:      Result Value   CO2 10 (*)    Glucose, Bld 157 (*)    BUN 103 (*)    Creatinine, Ser 2.89 (*)    AST <10 (*)    GFR, Estimated 16 (*)    Anion gap 19 (*)    All other components within  normal limits  CBC WITH DIFFERENTIAL/PLATELET - Abnormal; Notable for the following components:   Neutro Abs 7.9 (*)    Lymphs Abs 0.6 (*)    All other components within normal limits  URINALYSIS, ROUTINE W REFLEX MICROSCOPIC - Abnormal; Notable for the following components:   Color, Urine YELLOW (*)    APPearance HAZY (*)    Hgb urine dipstick MODERATE (*)    Nitrite POSITIVE (*)    Leukocytes,Ua MODERATE (*)    Bacteria, UA RARE (*)    All other components within normal limits  LIPASE, BLOOD - Abnormal; Notable for the following components:   Lipase 264 (*)    All other components within normal limits  TROPONIN T, HIGH SENSITIVITY - Abnormal; Notable for the following components:   Troponin T High Sensitivity 35 (*)    All  other components within normal limits  TROPONIN T, HIGH SENSITIVITY - Abnormal; Notable for the following components:   Troponin T High Sensitivity 35 (*)    All other components within normal limits  URINE CULTURE  GASTROINTESTINAL PANEL BY PCR, STOOL (REPLACES STOOL CULTURE)  MAGNESIUM     EKG Poor quality EKG with tremulous baseline.  Seems to demonstrate a sinus rhythm at a rate of 95 bpm.  Normal axis.  Multiple PVCs.  No STEMI  RADIOLOGY CT abdomen/pelvis interpreted by me without evidence of acute pathology  Official radiology report(s): CT ABDOMEN PELVIS WO CONTRAST Result Date: 04/08/2024 CLINICAL DATA:  Nausea/vomiting/diarrhea, found down EXAM: CT ABDOMEN AND PELVIS WITHOUT CONTRAST TECHNIQUE: Multidetector CT imaging of the abdomen and pelvis was performed following the standard protocol without IV contrast. RADIATION DOSE REDUCTION: This exam was performed according to the departmental dose-optimization program which includes automated exposure control, adjustment of the mA and/or kV according to patient size and/or use of iterative reconstruction technique. COMPARISON:  02/19/2023 FINDINGS: Lower chest: No acute pleural or parenchymal lung  disease. Hepatobiliary: Unremarkable unenhanced appearance of the liver and gallbladder. Pancreas: Unremarkable unenhanced appearance. Spleen: Unremarkable unenhanced appearance. Adrenals/Urinary Tract: No urinary tract calculi or obstructive uropathy within either kidney. The adrenals are unremarkable. Bladder is grossly normal. Stomach/Bowel: Postsurgical changes from prior small bowel resection and partial sigmoid colon resection. No bowel obstruction or ileus. No bowel wall thickening or inflammatory change. Vascular/Lymphatic: Aortic atherosclerosis. No enlarged abdominal or pelvic lymph nodes. Reproductive: Status post hysterectomy. No adnexal masses. Other: No free fluid or free intraperitoneal gas. No abdominal wall hernia. Musculoskeletal: No acute or destructive bony abnormalities. Reconstructed images demonstrate no additional findings. IMPRESSION: 1. No acute intra-abdominal or intrapelvic process. 2.  Aortic Atherosclerosis (ICD10-I70.0). Electronically Signed   By: Ozell Daring M.D.   On: 04/08/2024 20:01    PROCEDURES and INTERVENTIONS:  .1-3 Lead EKG Interpretation  Performed by: Claudene Rover, MD Authorized by: Claudene Rover, MD     Interpretation: normal     ECG rate:  68   ECG rate assessment: normal     Rhythm: sinus rhythm     Ectopy: none     Conduction: normal     Medications  cefTRIAXone  (ROCEPHIN ) 1 g in sodium chloride  0.9 % 100 mL IVPB (0 g Intravenous Stopped 04/08/24 2336)  sodium bicarbonate  150 mEq in dextrose  5 % 1,150 mL infusion ( Intravenous New Bag/Given 04/08/24 2336)  acetaminophen  (TYLENOL ) tablet 650 mg (has no administration in time range)    Or  acetaminophen  (TYLENOL ) suppository 650 mg (has no administration in time range)  ondansetron  (ZOFRAN ) tablet 4 mg (has no administration in time range)    Or  ondansetron  (ZOFRAN ) injection 4 mg (has no administration in time range)  HYDROcodone -acetaminophen  (NORCO/VICODIN) 5-325 MG per tablet 1-2 tablet  (has no administration in time range)  pantoprazole  (PROTONIX ) injection 40 mg (40 mg Intravenous Given 04/08/24 2250)  heparin  injection 5,000 Units (has no administration in time range)  lactated ringers  bolus 1,000 mL (0 mLs Intravenous Stopped 04/08/24 2336)     IMPRESSION / MDM / ASSESSMENT AND PLAN / ED COURSE  I reviewed the triage vital signs and the nursing notes.  Differential diagnosis includes, but is not limited to, sepsis, UTI, gastroenteritis, SBO, AKI, cardiac dysrhythmia, seizure  {Patient presents with symptoms of an acute illness or injury that is potentially life-threatening.  Patient presents with signs of dehydration and AKI after a week of nausea, vomiting and diarrhea.  Urine  concerning for infection and sent for culture, patient started on ceftriaxone  to address this.  She has elevated lipase, creatinine consistent with AKI.  Certainly quite dry on exam and improving with IV fluids.  No shock or instability.  Reassuring CT scan.  Consult with medicine for admission  Clinical Course as of 04/08/24 2351  Sat Apr 08, 2024  1748 Near syncope, N/V/D [DS]  1914 1 week N/V/D, diffusely tender [DS]  2131 Reassessed and discussed workup, plan of care. [DS]    Clinical Course User Index [DS] Claudene Rover, MD     FINAL CLINICAL IMPRESSION(S) / ED DIAGNOSES   Final diagnoses:  AKI (acute kidney injury)  Dehydration  Acute cystitis without hematuria     Rx / DC Orders   ED Discharge Orders     None        Note:  This document was prepared using Dragon voice recognition software and may include unintentional dictation errors.   Claudene Rover, MD 04/08/24 2352

## 2024-04-08 NOTE — ED Triage Notes (Signed)
 Pt coming from home via EMS. Son found pt on bathroom floor. Pt denies syncope episode, stating she laid down on the floor bc she felt sick, pt son states that's abnormal for pt and believes it was syncope episode. Pt reports having nausea for a week, but has only vomited once.  EMS positive orthostatics  135/76 lying down 110/54 sitting  EMS meds 500 mL NS 4 mg Zofran 

## 2024-04-09 ENCOUNTER — Encounter: Payer: Self-pay | Admitting: Internal Medicine

## 2024-04-09 DIAGNOSIS — I480 Paroxysmal atrial fibrillation: Secondary | ICD-10-CM

## 2024-04-09 DIAGNOSIS — N179 Acute kidney failure, unspecified: Secondary | ICD-10-CM | POA: Diagnosis not present

## 2024-04-09 LAB — CBC
HCT: 29.8 % — ABNORMAL LOW (ref 36.0–46.0)
Hemoglobin: 10.8 g/dL — ABNORMAL LOW (ref 12.0–15.0)
MCH: 31.4 pg (ref 26.0–34.0)
MCHC: 36.2 g/dL — ABNORMAL HIGH (ref 30.0–36.0)
MCV: 86.6 fL (ref 80.0–100.0)
Platelets: 159 K/uL (ref 150–400)
RBC: 3.44 MIL/uL — ABNORMAL LOW (ref 3.87–5.11)
RDW: 12.9 % (ref 11.5–15.5)
WBC: 7.9 K/uL (ref 4.0–10.5)
nRBC: 0 % (ref 0.0–0.2)

## 2024-04-09 LAB — BASIC METABOLIC PANEL WITH GFR
Anion gap: 13 (ref 5–15)
BUN: 88 mg/dL — ABNORMAL HIGH (ref 8–23)
CO2: 18 mmol/L — ABNORMAL LOW (ref 22–32)
Calcium: 8.4 mg/dL — ABNORMAL LOW (ref 8.9–10.3)
Chloride: 106 mmol/L (ref 98–111)
Creatinine, Ser: 1.91 mg/dL — ABNORMAL HIGH (ref 0.44–1.00)
GFR, Estimated: 26 mL/min — ABNORMAL LOW (ref >60)
Glucose, Bld: 114 mg/dL — ABNORMAL HIGH (ref 70–99)
Potassium: 3.4 mmol/L — ABNORMAL LOW (ref 3.5–5.1)
Sodium: 137 mmol/L (ref 135–145)

## 2024-04-09 MED ORDER — MAGNESIUM SULFATE 2 GM/50ML IV SOLN
2.0000 g | Freq: Once | INTRAVENOUS | Status: AC
  Administered 2024-04-09: 2 g via INTRAVENOUS
  Filled 2024-04-09: qty 50

## 2024-04-09 MED ORDER — POTASSIUM CHLORIDE CRYS ER 20 MEQ PO TBCR
40.0000 meq | EXTENDED_RELEASE_TABLET | Freq: Two times a day (BID) | ORAL | Status: AC
  Administered 2024-04-09 (×2): 40 meq via ORAL
  Filled 2024-04-09 (×2): qty 2

## 2024-04-09 MED ORDER — DILTIAZEM LOAD VIA INFUSION
10.0000 mg | Freq: Once | INTRAVENOUS | Status: DC
  Filled 2024-04-09: qty 10

## 2024-04-09 MED ORDER — STERILE WATER FOR INJECTION IV SOLN
INTRAVENOUS | Status: AC
  Filled 2024-04-09: qty 150

## 2024-04-09 MED ORDER — METOPROLOL TARTRATE 5 MG/5ML IV SOLN
5.0000 mg | Freq: Four times a day (QID) | INTRAVENOUS | Status: DC | PRN
  Administered 2024-04-09: 5 mg via INTRAVENOUS
  Filled 2024-04-09: qty 5

## 2024-04-09 MED ORDER — DILTIAZEM HCL-DEXTROSE 125-5 MG/125ML-% IV SOLN (PREMIX)
5.0000 mg/h | INTRAVENOUS | Status: DC
  Administered 2024-04-10: 5 mg/h via INTRAVENOUS
  Filled 2024-04-09: qty 125

## 2024-04-09 MED ORDER — ALUM & MAG HYDROXIDE-SIMETH 200-200-20 MG/5ML PO SUSP
30.0000 mL | ORAL | Status: DC | PRN
  Filled 2024-04-09: qty 30

## 2024-04-09 NOTE — ED Notes (Addendum)
 Dr. Cleatus advised pt should be on a clear liquid diet.

## 2024-04-09 NOTE — ED Notes (Signed)
Advised nurse that patient has ready

## 2024-04-09 NOTE — ED Notes (Signed)
 Assisted pt to bedside commode and back into the bed.

## 2024-04-09 NOTE — Progress Notes (Signed)
 Mobility Specialist Progress Note:    04/09/24 1702  Mobility  Activity Ambulated with assistance  Level of Assistance Minimal assist, patient does 75% or more  Assistive Device  (HHA)  Distance Ambulated (ft) 16 ft  Range of Motion/Exercises Active;All extremities  Activity Response Tolerated well  Mobility visit 1 Mobility  Mobility Specialist Start Time (ACUTE ONLY) 1658  Mobility Specialist Stop Time (ACUTE ONLY) 1710  Mobility Specialist Time Calculation (min) (ACUTE ONLY) 12 min   Assisted pt to bathroom with MinA and hand-held assist. Tolerated well, no LOB throughout. Left pt in fowlers with alarm on and RN at bedside, all needs met.  Sherrilee Ditty Mobility Specialist Please contact via Special Educational Needs Teacher or  Rehab office at [phone]

## 2024-04-09 NOTE — ED Notes (Signed)
 Advised nurse that patient has ready bed

## 2024-04-09 NOTE — Progress Notes (Incomplete)
 CROSS COVER NOTE  NAME: Tracy Haas MRN: [id number] DOB : 05-16-1946    Concern as stated by nurse / staff   patient had EKG done earlier and it showed possible afib and frequent PVC's. She is ok right now but got up and went to bathroom and her heart rate is going between 130-180's still and she been back in bed for about 8 minutes.      Pertinent findings on chart review:  -Admitted with urinary tract infection and acute kidney injury in the setting of acute enteritis  -HTN, HFpEF with moderate AI and MR, frequent PVCs -New A-fib seen on EKG on admission-rate controlled  Patient assessment  Date/Time Pulse ECG Heart Rate Resp BP SpO2  04/10/24 0145 84 170 Abnormal  19 114/67 97 %  04/10/24 0130 44 Abnormal  149 Abnormal  19 100/66 97 %  04/10/24 0115 30 Abnormal  173 Abnormal  22 Abnormal  104/69 96 %  04/10/24 0100 102 Abnormal  169 Abnormal  19 92/55 Abnormal  96 %  04/10/24 0045 87 159 Abnormal  17 84/67 Abnormal  95 %  04/10/24 0030 -- 162 Abnormal  18 80/54 Abnormal    Physical Exam Vitals and nursing note reviewed.  Constitutional:      General: She is not in acute distress.    Comments: Patient resting comfortably  HENT:     Head: Normocephalic and atraumatic.  Cardiovascular:     Rate and Rhythm: Tachycardia present. Rhythm irregular.     Heart sounds: Normal heart sounds.  Pulmonary:     Effort: Pulmonary effort is normal.     Breath sounds: Normal breath sounds.  Neurological:     Mental Status: Mental status is at baseline.        Workup Troponin T 30-->35 K+ 3.8 Mag 2.5      Assessment and  Interventions   Assessment:  -New onset A fib with RVR -NSVT -ST depression inferolateral leads  Plan: IV metoprolol  x 1, and will consider diltiazem  bolus and infusion if not improved Will get potassium(->3.6)and magnesium (->2.5) and replete as needed to get potassium above 4 and magnesium  over 2 ST-T wave changes on EKG she will get  troponins to evaluate for ACS (Trop T  30->35) Echo to evaluate LVEF, focal wall motion abnormality Cardiology consulted for the a.m. Will likely need systemic anticoagulation for primary stroke prevention Addendum#1:  -Heart rate still 140s to 170s following metoprolol  -Diltiazem  bolus and infusion ordered - Transfer to PCU Addendum #2: -Becoming hypotensive -NS bolus  -Resume Dilt infusion without bolus when BP allows -Renal function precludes IV dig for now but might do one-time dose(Cr 1.91->1.64) -Will consider amiodarone  if persistently tachycardic Addendum #3: -Still tachycardic, BP still soft - Continue bolus --Will give a one-time dose of IV dig Addendum #4: - Still tachycardic, 1 hours after dig - Amiodarone  bolus and infusion ordered    CRITICAL CARE Performed by: Delayne LULLA Solian   Total critical care time: 120 minutes  Critical care time was exclusive of separately billable procedures and treating other patients.  Critical care was necessary to treat or prevent imminent or life-threatening deterioration.  Critical care was time spent personally by me on the following activities: development of treatment plan with patient and/or surrogate as well as nursing, discussions with consultants, evaluation of patient's response to treatment, examination of patient, obtaining history from patient or surrogate, ordering and performing treatments and interventions, ordering and review of laboratory  studies, ordering and review of radiographic studies, pulse oximetry and re-evaluation of patient's condition.

## 2024-04-09 NOTE — ED Notes (Signed)
 Fall risk bundle is currently in place.

## 2024-04-09 NOTE — Progress Notes (Signed)
 Progress Note   Patient: Tracy Haas FMW:[id number] DOB: 1945/10/12 DOA: 04/08/2024     0 DOS: the patient was seen and examined on 04/09/2024   Brief hospital course:  [name] is a 78 y.o. female with medical history significant for Hypertension, HFpEF with moderate AI and MR, being admitted with urinary tract infection and acute kidney injury in the setting of acute enteritis.  Patient has been having  diarrhea for the past week with worsening weakness.  On the day of arrival, she was found on the floor by her son awake and alert.  States she was so weak she lowered herself to the floor after using the bathroom.  Denies falling or hitting her head  In the ED BP 127/48 and vitals WNL Labs notable for normal WBC and otherwise normal CBC Lipase 264 with normal LFTs Creatinine 2.89 up from baseline of 0.9 a month ago with metabolic acidosis with bicarb of 10 and anion gap of 19 Troponin 35 EKG showed possible A-fib at 95 with PVCs CT abdomen and pelvis nonacute UA consistent with UTI Patient treated with an LR bolus and Rocephin  Admission requested    Assessment and Plan:   AKI (acute kidney injury) High anion gap metabolic acidosis Secondary to GI losses from diarrhea resulting in ATN with concomitant diuretic and ACE inhibitor use On admission, patient noted to have creatinine 2.89 up from baseline of 0.9, bicarb 10 and anion gap 19.  BUN elevated at 103 Patient received bicarbonate infusion with improvement in her serum creatinine to 1.9 and persistent anion gap metabolic acidosis improved from admission.  BUN remains elevated but improved from admission and is down to 88 Continue bicarbonate infusion    Urinary tract infection Patient noted to have pyuria She had complaints of frequency and dysuria Continue IV Rocephin  empirically while waiting for urine culture results    Acute gastroenteritis No further episodes Will send stool studies if patient has another  episode    Possible A-fib on EKG, with PVCs Continuous cardiac monitoring    Elevated lipase Suspect reactive related to acute gastroenteritis CT abdomen and pelvis was nonacute    Generalized weakness Secondary to UTI, dehydration Expecting improvement with treatment Fall precautions PT evaluation    Chronic heart failure with preserved ejection fraction (HFpEF) (HCC) Patient clinically dry Patient is normotensive Continue to hold bisoprolol  HCTZ, ramipril   Judicious IV fluid hydration    Essential hypertension Patient is normotensive. Continue to hold blood pressure meds       Subjective: No further episodes of diarrhea  Physical Exam: Vitals:   04/08/24 2341 04/09/24 0250 04/09/24 0620 04/09/24 1043  BP: (!) 101/54 109/63 (!) 110/58 (!) 119/57  Pulse: 66 (!) 102 (!) 105 85  Resp: 17 18 (!) 22 20  Temp: 98.2 F (36.8 C) 98.4 F (36.9 C) 98.5 F (36.9 C) 97.7 F (36.5 C)  TempSrc: Oral Oral Oral Oral  SpO2: 100% 100% 100% 100%  Weight:      Height:       Vitals and nursing note reviewed.  Constitutional:      General: She is not in acute distress.  Generalized weakness HENT:     Head: Normocephalic and atraumatic.  Cardiovascular:     Rate and Rhythm: Normal rate and regular rhythm.     Heart sounds: Normal heart sounds.  Pulmonary:     Effort: Pulmonary effort is normal.     Breath sounds: Normal breath sounds.  Abdominal:  Palpations: Abdomen is soft.     Tenderness: There is no abdominal tenderness.  Neurological:     Mental Status: Mental status is at baseline.  Oriented to person and place     Data Reviewed: Patient noted to have pyuria, hemoglobin 10.8, potassium 3.4, bicarb 18, creatinine 1.91, BUN 88 Labs reviewed  Family Communication: Plan of care was discussed in detail with patient's son Blimi Godby over the phone.  All questions and concerns have been addressed.  He verbalizes understanding and agrees to the  plan.  Disposition: Status is: Observation The patient will require care spanning > 2 midnights and should be moved to inpatient because: Continues to require IV fluid hydration and antibiotics  Planned Discharge Destination: TBD    Time spent: 50 minutes  Author: Aimee Somerset, MD 04/09/2024 1:51 PM  For on call review [email or web address].

## 2024-04-10 ENCOUNTER — Inpatient Hospital Stay
Admit: 2024-04-10 | Discharge: 2024-04-10 | Disposition: A | Discharge status: Home / Self Care | Attending: Internal Medicine | Admitting: Internal Medicine

## 2024-04-10 ENCOUNTER — Other Ambulatory Visit (HOSPITAL_COMMUNITY): Payer: Self-pay

## 2024-04-10 DIAGNOSIS — N179 Acute kidney failure, unspecified: Secondary | ICD-10-CM | POA: Diagnosis not present

## 2024-04-10 LAB — CBC
HCT: 28.5 % — ABNORMAL LOW (ref 36.0–46.0)
Hemoglobin: 10.1 g/dL — ABNORMAL LOW (ref 12.0–15.0)
MCH: 31.3 pg (ref 26.0–34.0)
MCHC: 35.4 g/dL (ref 30.0–36.0)
MCV: 88.2 fL (ref 80.0–100.0)
Platelets: 138 K/uL — ABNORMAL LOW (ref 150–400)
RBC: 3.23 MIL/uL — ABNORMAL LOW (ref 3.87–5.11)
RDW: 12.9 % (ref 11.5–15.5)
WBC: 6 K/uL (ref 4.0–10.5)
nRBC: 0 % (ref 0.0–0.2)

## 2024-04-10 LAB — ECHOCARDIOGRAM COMPLETE
AR max vel: 2.78 cm2
AV Area VTI: 2.42 cm2
AV Area mean vel: 2.67 cm2
AV Mean grad: 2 mmHg
AV Peak grad: 4.2 mmHg
Ao pk vel: 1.03 m/s
Area-P 1/2: 5.58 cm2
Height: 59 in
MV VTI: 1.64 cm2
P 1/2 time: 402 ms
S' Lateral: 2.3 cm
Weight: 1748.8 [oz_av]

## 2024-04-10 LAB — BASIC METABOLIC PANEL WITH GFR
Anion gap: 12 (ref 5–15)
Anion gap: 10 (ref 5–15)
BUN: 70 mg/dL — ABNORMAL HIGH (ref 8–23)
BUN: 63 mg/dL — ABNORMAL HIGH (ref 8–23)
CO2: 23 mmol/L (ref 22–32)
CO2: 21 mmol/L — ABNORMAL LOW (ref 22–32)
Calcium: 8 mg/dL — ABNORMAL LOW (ref 8.9–10.3)
Calcium: 7.9 mg/dL — ABNORMAL LOW (ref 8.9–10.3)
Chloride: 104 mmol/L (ref 98–111)
Chloride: 108 mmol/L (ref 98–111)
Creatinine, Ser: 1.64 mg/dL — ABNORMAL HIGH (ref 0.44–1.00)
Creatinine, Ser: 1.46 mg/dL — ABNORMAL HIGH (ref 0.44–1.00)
GFR, Estimated: 32 mL/min — ABNORMAL LOW (ref >60)
GFR, Estimated: 36 mL/min — ABNORMAL LOW (ref >60)
Glucose, Bld: 120 mg/dL — ABNORMAL HIGH (ref 70–99)
Glucose, Bld: 112 mg/dL — ABNORMAL HIGH (ref 70–99)
Potassium: 3.6 mmol/L (ref 3.5–5.1)
Potassium: 3.8 mmol/L (ref 3.5–5.1)
Sodium: 138 mmol/L (ref 135–145)
Sodium: 139 mmol/L (ref 135–145)

## 2024-04-10 LAB — TROPONIN T, HIGH SENSITIVITY
Troponin T High Sensitivity: 30 ng/L — ABNORMAL HIGH (ref 0–19)
Troponin T High Sensitivity: 35 ng/L — ABNORMAL HIGH (ref 0–19)

## 2024-04-10 LAB — GASTROINTESTINAL PANEL BY PCR, STOOL (REPLACES STOOL CULTURE)

## 2024-04-10 LAB — MAGNESIUM
Magnesium: 2.5 mg/dL — ABNORMAL HIGH (ref 1.7–2.4)
Magnesium: 2.4 mg/dL (ref 1.7–2.4)

## 2024-04-10 MED ORDER — SODIUM CHLORIDE 0.9 % IV BOLUS
500.0000 mL | Freq: Once | INTRAVENOUS | Status: AC
  Administered 2024-04-10: 500 mL via INTRAVENOUS

## 2024-04-10 MED ORDER — AMIODARONE HCL IN DEXTROSE 360-4.14 MG/200ML-% IV SOLN
60.0000 mg/h | INTRAVENOUS | Status: AC
  Administered 2024-04-10 (×2): 60 mg/h via INTRAVENOUS
  Filled 2024-04-10 (×2): qty 200

## 2024-04-10 MED ORDER — SODIUM CHLORIDE 0.9 % IV BOLUS
1000.0000 mL | Freq: Once | INTRAVENOUS | Status: AC
  Administered 2024-04-10: 1000 mL via INTRAVENOUS

## 2024-04-10 MED ORDER — AMIODARONE HCL IN DEXTROSE 360-4.14 MG/200ML-% IV SOLN
30.0000 mg/h | INTRAVENOUS | Status: AC
  Administered 2024-04-10 – 2024-04-11 (×3): 30 mg/h via INTRAVENOUS
  Filled 2024-04-10 (×2): qty 200

## 2024-04-10 MED ORDER — APIXABAN 5 MG PO TABS
5.0000 mg | ORAL_TABLET | Freq: Two times a day (BID) | ORAL | Status: DC
  Administered 2024-04-10 – 2024-04-16 (×13): 5 mg via ORAL
  Filled 2024-04-10 (×13): qty 1

## 2024-04-10 MED ORDER — SODIUM CHLORIDE 0.9 % IV SOLN: INTRAVENOUS | Status: DC

## 2024-04-10 MED ORDER — DIGOXIN 0.25 MG/ML IJ SOLN
0.2500 mg | Freq: Once | INTRAMUSCULAR | Status: AC
  Administered 2024-04-10: 0.25 mg via INTRAVENOUS
  Filled 2024-04-10: qty 2

## 2024-04-10 MED ORDER — AMIODARONE LOAD VIA INFUSION
150.0000 mg | Freq: Once | INTRAVENOUS | Status: AC
  Administered 2024-04-10: 150 mg via INTRAVENOUS
  Filled 2024-04-10: qty 83.34

## 2024-04-10 NOTE — Progress Notes (Signed)
 Progress Note   Patient: Tracy Haas FMW:[id number] DOB: 31-May-1945 DOA: 04/08/2024     1 DOS: the patient was seen and examined on 04/10/2024   Brief hospital course:  ALOMA BOCH is a 78 y.o. female with medical history significant for Hypertension, HFpEF with moderate AI and MR, being admitted with urinary tract infection and acute kidney injury in the setting of acute enteritis.  Patient has been having  diarrhea for the past week with worsening weakness.  On the day of arrival, she was found on the floor by her son awake and alert.  States she was so weak she lowered herself to the floor after using the bathroom.  Denies falling or hitting her head  In the ED BP 127/48 and vitals WNL Labs notable for normal WBC and otherwise normal CBC Lipase 264 with normal LFTs Creatinine 2.89 up from baseline of 0.9 a month ago with metabolic acidosis with bicarb of 10 and anion gap of 19 Troponin 35 EKG showed possible A-fib at 95 with PVCs CT abdomen and pelvis nonacute UA consistent with UTI Patient treated with an LR bolus and Rocephin  Admission requested    Assessment and Plan:  Paroxysmal A-fib (new onset) Events of last night noted Patient developed rapid A-fib overnight with heart rates between 130 and 180 Started on an amiodarone  drip and converted to sinus rhythm on 11/24 Patient has a CHADS2VASC score of 4 and requires long-term anticoagulation as primary prophylaxis for an acute stroke Will start patient  on Eliquis  5 mg twice daily Continue amiodarone  drip Will consult cardiology Follow-up results of 2D echocardiogram to rule out valvular pathology     AKI (acute kidney injury) High anion gap metabolic acidosis Secondary to GI losses from diarrhea resulting in ATN with concomitant diuretic and ACE inhibitor use On admission, patient noted to have creatinine 2.89 up from baseline of 0.9, bicarb 10 and anion gap 19.  BUN elevated at 103 Patient received bicarbonate  infusion with improvement in her serum creatinine to 1.46 with resolution of her metabolic acidosis Will switch patient to normal saline and monitor renal function closely      Myocardial injury Secondary to demand ischemia from tachycardia Troponin levels are flat and not concerning for an acute coronary syndrome Follow-up results of 2D echocardiogram to rule out regional wall motion abnormality    Urinary tract infection Patient noted to have pyuria on admission She had complaints of frequency and dysuria Urine culture yields greater than 100,000 colonies of E. coli, sensitivity pending Continue IV Rocephin  empirically        Acute gastroenteritis Elevated lipase No further episodes of diarrhea Elevated lipase appears to be reactive and secondary to acute gastroenteritis CT scan of abdomen and pelvis showed no acute findings       Generalized weakness Secondary to UTI, AKI Expecting improvement with treatment Fall precautions PT evaluation     Chronic heart failure with preserved ejection fraction (HFpEF) (HCC) Patient clinically dry Patient is normotensive Continue to hold bisoprolol  HCTZ, ramipril   Judicious IV fluid hydration     Essential hypertension Patient is normotensive. Continue to hold blood pressure meds            Subjective: Complains of nausea but denies having any chest pain, palpitations or shortness of breath.   Physical Exam: Vitals:   04/10/24 1000 04/10/24 1100 04/10/24 1147 04/10/24 1200  BP: 133/73 116/65 109/68 (!) 99/58  Pulse:   83   Resp: 16 18  15  Temp:   98 F (36.7 C)   TempSrc:   Oral   SpO2:   98%   Weight:      Height:       Vitals and nursing note reviewed.  Constitutional:      General: She is not in acute distress.  Generalized weakness HENT:     Head: Normocephalic and atraumatic.  Cardiovascular:     Rate and Rhythm: Normal rate and regular rhythm.     Heart sounds: Normal heart sounds.  Pulmonary:      Effort: Pulmonary effort is normal.     Breath sounds: Normal breath sounds.  Abdominal:     Palpations: Abdomen is soft.     Tenderness: There is no abdominal tenderness.  Neurological:     Mental Status: Mental status is at baseline.  Oriented to person and place    Data Reviewed: Labs reviewed.  Hemoglobin 10.1, hematocrit 28.5, troponin 30 >> 35, bicarb 21, BUN 63, creatinine 1.46 Labs reviewed  Family Communication: Called and updated patient's son about her new diagnosis of paroxysmal A-fib and the need to start anticoagulation as primary prophylaxis for an acute stroke.  Also updated him on the results of her urine culture and renal function as well.  All questions and concerns have been addressed.  He verbalizes understanding and agrees with the plan.  Disposition: Status is: Inpatient Remains inpatient appropriate because: On IV Amiodarone  for rapid A-fib and IV antibiotics for UTI while awaiting results of urine culture  Planned Discharge Destination: TBD    Time spent: 55 minutes  Author: Aimee Somerset, MD 04/10/2024 2:14 PM  For on call review [email or web address].

## 2024-04-10 NOTE — Consult Note (Signed)
 [facility] CLINIC CARDIOLOGY CONSULT NOTE       Patient ID: Tracy Haas MRN: [id number] DOB/AGE: 01-08-1946 78 y.o.  Admit date: 04/08/2024 Referring Physician Dr. Delayne Solian Primary Physician Cleotilde Oneil FALCON, MD  Primary Cardiologist None Reason for Consultation AF RVR  HPI: Tracy Haas is a 78 y.o. female  with a past medical history of chronic HFpEF, hypertension who presented to the ED on 04/08/2024 for diarrhea and weakness. Being treated for UTI and gastroenteritis. Overnight on 04/09/24 she developed atrial fibrillation RVR. Cardiology was consulted for further evaluation.   Patient with episodes of diarrhea and worsening weakness. Decided to come in for evaluation. Diagnosed with UTI and gastroenteritis and admitted. Overnight on 04/09/24 developed AF RVR. Notable labs today include creatinine 1.46 (up to 2.89 on admission), potassium 3.8, hemoglobin 10.1, WBC 6.0. Troponins 35 > 30 > 35. EKG overnight yesterday with AF RVR PVCs rate 174 bpm.   Patient seen and examined this morning, resting comfortably in hospital bed.  We discussed her presentation to the hospital in further detail.  Also discussed her episode of tachycardia overnight.  She states that she did not have any palpitation symptoms last night whenever her heart rate was elevated.  She denies any chest pain, shortness of breath.  Overall is still feeling somewhat weak but slowly improving.  Review of systems complete and found to be negative unless listed above    Past Medical History:  Diagnosis Date   Adult hypothyroidism 11/05/2013   Anxiety    B12 deficiency 11/07/2013   BP (high blood pressure) 02/11/2012   Diverticulitis    Family history of adverse reaction to anesthesia    mother- possible problems with intubation.   Fatty infiltration of liver 04/18/2012   Overview:  US  finding 03/2012    GERD (gastroesophageal reflux disease)    Hematuria 01/22/2012   High cholesterol    HLD (hyperlipidemia)  10/14/2012   Hypertension    Hypothyroidism    Panic attacks    Stenosis of intestine (HCC)     Past Surgical History:  Procedure Laterality Date   ABDOMINAL HYSTERECTOMY  1989   BREAST BIOPSY Right 1990's   neg   COLON RESECTION SIGMOID N/A 07/24/2016   Procedure: COLON RESECTION SIGMOID;  Surgeon: Carlin Pastel, MD;  Location: [facility] ORS;  Service: General;  Laterality: N/A;   COLONOSCOPY WITH PROPOFOL  N/A 04/16/2015   Procedure: COLONOSCOPY WITH PROPOFOL ;  Surgeon: Rogelia Copping, MD;  Location: [facility];  Service: Endoscopy;  Laterality: N/A;   CYSTOSCOPY WITH STENT PLACEMENT Bilateral 07/24/2016   Procedure: CYSTOSCOPY WITH STENT PLACEMENT;  Surgeon: Redell Lynwood Napoleon, MD;  Location: [facility];  Service: Urology;  Laterality: Bilateral;   LAPAROSCOPIC SIGMOID COLECTOMY N/A 07/24/2016   Procedure: LAPAROSCOPIC SIGMOID COLECTOMY;  Surgeon: Carlin Pastel, MD;  Location: [facility] OR[facility];  Service: General;  Laterality: N/A;   SMALL INTESTINE SURGERY  1989   Dr. Dellie- (After Hysterectomy)   TRIGGER FINGER RELEASE Right 04/15/2016   Procedure: RELEASE TRIGGER FINGER/A-1 PULLEY;  Surgeon: Norleen JINNY Maltos, MD;  Location: [facility];  Service: Orthopedics;  Laterality: Right;    Medications Prior to Admission  Medication Sig Dispense Refill Last Dose/Taking   bisoprolol -hydrochlorothiazide  (ZIAC ) 5-6.25 MG tablet Take 1 tablet by mouth daily.   04/07/2024   Ca Phosphate-Cholecalciferol (CALCIUM 500 + D3) 250-500 MG-UNIT CHEW Chew 2 each by mouth daily.   04/07/2024   Cyanocobalamin (RA VITAMIN B-12 TR) 1000 MCG TBCR Take 1 tablet by mouth daily.  04/07/2024   ibuprofen (ADVIL) 200 MG tablet Take 200 mg by mouth every 6 (six) hours as needed for cramping.   Unknown   potassium chloride  (KLOR-CON ) 10 MEQ tablet Take 10 mEq by mouth 2 (two) times daily.   04/07/2024   ramipril  (ALTACE ) 10 MG capsule Take 10 mg by mouth daily.    04/07/2024   simvastatin  (ZOCOR ) 20 MG tablet Take 20 mg by  mouth daily.   04/07/2024   torsemide (DEMADEX) 20 MG tablet Take 10 mg by mouth once.   04/07/2024   ascorbic acid (VITAMIN C) 500 MG tablet Take 500 mg by mouth daily. (Patient not taking: Reported on 04/08/2024)   Not Taking   cephALEXin  (KEFLEX ) 500 MG capsule Take 1 capsule (500 mg total) by mouth 2 (two) times daily. (Patient not taking: Reported on 04/08/2024) 14 capsule 0 Not Taking   levothyroxine  (SYNTHROID , LEVOTHROID) 75 MCG tablet Take 1 tablet by mouth daily before breakfast.  (Patient not taking: Reported on 04/08/2024)   Not Taking   omeprazole (PRILOSEC) 20 MG capsule Take 1 capsule by mouth daily. (Patient not taking: Reported on 04/08/2024)   Not Taking   Social History   Socioeconomic History   Marital status: Widowed    Spouse name: Not on file   Number of children: Not on file   Years of education: Not on file   Highest education level: Not on file  Occupational History   Not on file  Tobacco Use   Smoking status: Never   Smokeless tobacco: Never  Substance and Sexual Activity   Alcohol use: No   Drug use: No   Sexual activity: Not on file  Other Topics Concern   Not on file  Social History Narrative   Not on file   Social Drivers of Health   Financial Resource Strain: Low Risk  (03/07/2024)   Received from [facility]   Overall Financial Resource Strain (CARDIA)    Difficulty of Paying Living Expenses: Not hard at all  Food Insecurity: No Food Insecurity (04/09/2024)   Hunger Vital Sign    Worried About Running Out of Food in the Last Year: Never true    Ran Out of Food in the Last Year: Never true  Transportation Needs: No Transportation Needs (04/09/2024)   PRAPARE - Administrator, Civil Service (Medical): No    Lack of Transportation (Non-Medical): No  Physical Activity: Not on file  Stress: Not on file  Social Connections: Socially Integrated (04/09/2024)   Social Connection and Isolation Panel    Frequency of  Communication with Friends and Family: More than three times a week    Frequency of Social Gatherings with Friends and Family: More than three times a week    Attends Religious Services: More than 4 times per year    Active Member of [employer] or Organizations: Yes    Attends Engineer, Structural: More than 4 times per year    Marital Status: Married  Catering Manager Violence: Not At Risk (04/09/2024)   Humiliation, Afraid, Rape, and Kick questionnaire    Fear of Current or Ex-Partner: No    Emotionally Abused: No    Physically Abused: No    Sexually Abused: No    Family History  Problem Relation Age of Onset   Hypertension Mother    Cirrhosis Mother    Hypertension Father    Diabetes Father    Kidney failure Father    Breast cancer Neg  Hx      Vitals:   04/10/24 0700 04/10/24 0715 04/10/24 0730 04/10/24 0751  BP: (!) 82/61 99/71 (!) 87/54 108/66  Pulse: (!) 144 (!) 147 (!) 30   Resp: 15 10 17 13   Temp:    98.1 F (36.7 C)  TempSrc:    Oral  SpO2: 95% 95% 96%   Weight:      Height:        PHYSICAL EXAM General: Chronically ill appearing elderly female, well nourished, in no acute distress. HEENT: Normocephalic and atraumatic. Neck: No JVD.  Lungs: Normal respiratory effort on room air. Clear bilaterally to auscultation. No wheezes, crackles, rhonchi.  Heart: HRRR. Normal S1 and S2 without gallops or murmurs.  Abdomen: Non-distended appearing.  Msk: Normal strength and tone for age. Extremities: Warm and well perfused. No clubbing, cyanosis. No edema.  Neuro: Alert and oriented X 3. Psych: Answers questions appropriately.   Labs: Basic Metabolic Panel: Recent Labs    04/10/24 0010 04/10/24 0331  NA 138 139  K 3.6 3.8  CL 104 108  CO2 23 21*  GLUCOSE 120* 112*  BUN 70* 63*  CREATININE 1.64* 1.46*  CALCIUM 8.0* 7.9*  MG 2.5* 2.4   Liver Function Tests: Recent Labs    04/08/24 1803  AST <10*  ALT 6  ALKPHOS 80  BILITOT 0.7  PROT 6.6  ALBUMIN  3.7   Recent Labs    04/08/24 1803  LIPASE 264*   CBC: Recent Labs    04/08/24 1803 04/09/24 1103 04/10/24 0331  WBC 9.4 7.9 6.0  NEUTROABS 7.9*  --   --   HGB 12.8 10.8* 10.1*  HCT 36.1 29.8* 28.5*  MCV 88.3 86.6 88.2  PLT 197 159 138*   Cardiac Enzymes: No results for input(s): CKTOTAL, CKMB, CKMBINDEX, TROPONINIHS in the last 72 hours. BNP: No results for input(s): BNP in the last 72 hours. D-Dimer: No results for input(s): DDIMER in the last 72 hours. Hemoglobin A1C: No results for input(s): HGBA1C in the last 72 hours. Fasting Lipid Panel: No results for input(s): CHOL, HDL, LDLCALC, TRIG, CHOLHDL, LDLDIRECT in the last 72 hours. Thyroid Function Tests: No results for input(s): TSH, T4TOTAL, T3FREE, THYROIDAB in the last 72 hours.  Invalid input(s): FREET3 Anemia Panel: No results for input(s): VITAMINB12, FOLATE, FERRITIN, TIBC, IRON, RETICCTPCT in the last 72 hours.   Radiology: CT ABDOMEN PELVIS WO CONTRAST Result Date: 04/08/2024 CLINICAL DATA:  Nausea/vomiting/diarrhea, found down EXAM: CT ABDOMEN AND PELVIS WITHOUT CONTRAST TECHNIQUE: Multidetector CT imaging of the abdomen and pelvis was performed following the standard protocol without IV contrast. RADIATION DOSE REDUCTION: This exam was performed according to the departmental dose-optimization program which includes automated exposure control, adjustment of the mA and/or kV according to patient size and/or use of iterative reconstruction technique. COMPARISON:  02/19/2023 FINDINGS: Lower chest: No acute pleural or parenchymal lung disease. Hepatobiliary: Unremarkable unenhanced appearance of the liver and gallbladder. Pancreas: Unremarkable unenhanced appearance. Spleen: Unremarkable unenhanced appearance. Adrenals/Urinary Tract: No urinary tract calculi or obstructive uropathy within either kidney. The adrenals are unremarkable. Bladder is grossly normal.  Stomach/Bowel: Postsurgical changes from prior small bowel resection and partial sigmoid colon resection. No bowel obstruction or ileus. No bowel wall thickening or inflammatory change. Vascular/Lymphatic: Aortic atherosclerosis. No enlarged abdominal or pelvic lymph nodes. Reproductive: Status post hysterectomy. No adnexal masses. Other: No free fluid or free intraperitoneal gas. No abdominal wall hernia. Musculoskeletal: No acute or destructive bony abnormalities. Reconstructed images demonstrate no additional findings. IMPRESSION:  1. No acute intra-abdominal or intrapelvic process. 2.  Aortic Atherosclerosis (ICD10-I70.0). Electronically Signed   By: Ozell Daring M.D.   On: 04/08/2024 20:01    ECHO ordered  TELEMETRY (personally reviewed): NSR PVCs rate 70s  EKG (personally reviewed): AF RVR PVCs rate 174 bpm  Data reviewed by me 04/10/2024: last 24h vitals tele labs imaging I/O ED provider note, admission H&P  Principal Problem:   AKI (acute kidney injury) Active Problems:   Essential hypertension   Urinary tract infection   Acute gastroenteritis   Chronic heart failure with preserved ejection fraction (HFpEF) (HCC)   Generalized weakness   Elevated lipase   High anion gap metabolic acidosis   Possible A-fib on EKG, with PVCs    ASSESSMENT AND PLAN:  Tracy Haas is a 78 y.o. female  with a past medical history of chronic HFpEF, hypertension who presented to the ED on 04/08/2024 for diarrhea and weakness. Being treated for UTI and gastroenteritis. Overnight on 04/09/24 she developed atrial fibrillation RVR. Cardiology was consulted for further evaluation.   # Atrial fibrillation RVR # New onset atrial fibrillation # UTI, gastroenteritis # Chronic HFpEF # Hypertension Patient initially presented with complaints of weakness, diarrhea. Admitted for gastroenteritis, UTI. Overnight 04/09/24 developed AF RVR and started on IV amiodarone . Converted to NSR around 0730 on 04/10/24.   -Echo ordered. Further recommendations pending these results.  -Continue IV amiodarone  today, likely plan to transition to PO tomorrow. -Continue eliquis  5 mg twice daily. -Continue to treat causes of increased adrenergic tone including fever, infection, hypoxia, pain, etc.    This patient's plan of care was discussed and created with Dr. Custovic and she is in agreement.  Signed: Danita Bloch, PA-C  04/10/2024, 9:46 AM [facility] Cardiology

## 2024-04-10 NOTE — Progress Notes (Signed)
 PT Cancellation Note  Patient Details Name: Tracy Haas MRN: [id number] DOB: 1945-06-22   Cancelled Treatment:    Reason Eval/Treat Not Completed: Other (comment). Consult received and chart reviewed. New amio drip started this AM with pending Cardio consult. Abnormal HR at this time. Not appropriate for exertion at this time. Will re-attempt when medically cleared.   Caelen Reierson 04/10/2024, 9:07 AM Corean Dade, PT, DPT, [facility] [phone]

## 2024-04-10 NOTE — Progress Notes (Signed)
 Assisted patient to restroom this evening and upon return to bed cardiac monitor tech called to say patients heart rate was elevated and irregular. Notified Dr. Cleatus, new orders placed for EKG and metoprolol . 25 minutes after metoprolol  given no change in heart rate for patient. Notified Dr. Cleatus again, new orders placed for transfer to PCU, diltiazem  drip and lab work. Report given and patient transferred to 239-B.

## 2024-04-10 NOTE — Plan of Care (Signed)
  Problem: Education: Goal: Knowledge of General Education information will improve Description: Including pain rating scale, medication(s)/side effects and non-pharmacologic comfort measures Outcome: Progressing   Problem: Health Behavior/Discharge Planning: Goal: Ability to manage health-related needs will improve Outcome: Progressing   Problem: Clinical Measurements: Goal: Cardiovascular complication will be avoided Outcome: Progressing   Problem: Activity: Goal: Risk for activity intolerance will decrease Outcome: Progressing   Problem: Nutrition: Goal: Adequate nutrition will be maintained Outcome: Progressing   Problem: Coping: Goal: Level of anxiety will decrease Outcome: Progressing   Problem: Safety: Goal: Ability to remain free from injury will improve Outcome: Progressing

## 2024-04-10 NOTE — Plan of Care (Signed)

## 2024-04-10 NOTE — Progress Notes (Signed)
*  PRELIMINARY RESULTS* Echocardiogram 2D Echocardiogram has been performed.  Floydene Harder 04/10/2024, 1:46 PM

## 2024-04-11 ENCOUNTER — Other Ambulatory Visit: Payer: Self-pay

## 2024-04-11 DIAGNOSIS — N179 Acute kidney failure, unspecified: Secondary | ICD-10-CM | POA: Diagnosis not present

## 2024-04-11 LAB — CBC
HCT: 27.3 % — ABNORMAL LOW (ref 36.0–46.0)
Hemoglobin: 9.1 g/dL — ABNORMAL LOW (ref 12.0–15.0)
MCH: 31.4 pg (ref 26.0–34.0)
MCHC: 33.3 g/dL (ref 30.0–36.0)
MCV: 94.1 fL (ref 80.0–100.0)
Platelets: 134 10*3/uL — ABNORMAL LOW (ref 150–400)
RBC: 2.9 MIL/uL — ABNORMAL LOW (ref 3.87–5.11)
RDW: 12.9 % (ref 11.5–15.5)
WBC: 4.8 10*3/uL (ref 4.0–10.5)
nRBC: 0 % (ref 0.0–0.2)

## 2024-04-11 LAB — BASIC METABOLIC PANEL WITH GFR
Anion gap: 10 (ref 5–15)
BUN: 35 mg/dL — ABNORMAL HIGH (ref 8–23)
CO2: 22 mmol/L (ref 22–32)
Calcium: 7.7 mg/dL — ABNORMAL LOW (ref 8.9–10.3)
Chloride: 107 mmol/L (ref 98–111)
Creatinine, Ser: 1.12 mg/dL — ABNORMAL HIGH (ref 0.44–1.00)
GFR, Estimated: 50 mL/min — ABNORMAL LOW
Glucose, Bld: 108 mg/dL — ABNORMAL HIGH (ref 70–99)
Potassium: 3.4 mmol/L — ABNORMAL LOW (ref 3.5–5.1)
Sodium: 139 mmol/L (ref 135–145)

## 2024-04-11 LAB — MAGNESIUM: Magnesium: 1.6 mg/dL — ABNORMAL LOW (ref 1.7–2.4)

## 2024-04-11 MED ORDER — MAGNESIUM SULFATE 2 GM/50ML IV SOLN
2.0000 g | Freq: Once | INTRAVENOUS | Status: AC
  Administered 2024-04-11: 2 g via INTRAVENOUS
  Filled 2024-04-11: qty 50

## 2024-04-11 MED ORDER — AMIODARONE HCL 200 MG PO TABS
400.0000 mg | ORAL_TABLET | Freq: Two times a day (BID) | ORAL | Status: DC
  Administered 2024-04-11 – 2024-04-15 (×9): 400 mg via ORAL
  Filled 2024-04-11 (×9): qty 2

## 2024-04-11 MED ORDER — CEPHALEXIN 500 MG PO CAPS
500.0000 mg | ORAL_CAPSULE | Freq: Three times a day (TID) | ORAL | Status: AC
  Administered 2024-04-12 – 2024-04-13 (×4): 500 mg via ORAL
  Filled 2024-04-11 (×4): qty 1

## 2024-04-11 MED ORDER — POTASSIUM CHLORIDE CRYS ER 20 MEQ PO TBCR
40.0000 meq | EXTENDED_RELEASE_TABLET | Freq: Once | ORAL | Status: AC
  Administered 2024-04-11: 40 meq via ORAL
  Filled 2024-04-11: qty 2

## 2024-04-11 MED ORDER — BISOPROLOL FUMARATE 5 MG PO TABS
5.0000 mg | ORAL_TABLET | Freq: Every day | ORAL | Status: DC
  Administered 2024-04-11 – 2024-04-16 (×6): 5 mg via ORAL
  Filled 2024-04-11 (×6): qty 1

## 2024-04-11 MED ORDER — AMIODARONE HCL 200 MG PO TABS: 200.0000 mg | ORAL_TABLET | Freq: Every day | ORAL | Status: DC

## 2024-04-11 NOTE — Evaluation (Signed)
 Physical Therapy Evaluation Patient Details Name: Tracy Haas MRN: [id number] DOB: 11-15-1945 Today's Date: 04/11/2024  History of Present Illness  Tracy Haas is a 78 y.o. female with medical history significant for Hypertension, HFpEF with moderate AI and MR, being admitted with urinary tract infection and acute kidney injury in the setting of acute enteritis.  Patient has been having  diarrhea for the past week with worsening weakness.  On the day of arrival, she was found on the floor by her son awake and alert.  States she was so weak she lowered herself to the floor after using the bathroom.  Denies falling or hitting her head.  Clinical Impression  Pt is a very pleasant 78 y.o. female admitted d/t AKI. Pt was received in recliner and was alert and agreeable to PT evaluation. Pt vocalized the need to urinate upon entry. She was able to perform STS with CGA to RW with cues for safe hand placement and ambulated 5 ft to the [facility]. She was able to carryout hygiene with CGA during standing to wipe herself. She then ambulated 5 ft back to the recliner with CGA and was left in recliner with nursing in room to disconnect magnesium  drip. HR remained 83 or below throughout session. Pt will continue to benefit from skilled PT services to address her decline in functional mobility, strength, and activity tolerance.        If plan is discharge home, recommend the following: A little help with walking and/or transfers   Can travel by private vehicle   Yes    Equipment Recommendations Rolling walker (2 wheels);BSC/3in1  Recommendations for Other Services  OT consult    Functional Status Assessment Patient has had a recent decline in their functional status and demonstrates the ability to make significant improvements in function in a reasonable and predictable amount of time.     Precautions / Restrictions Precautions Precautions: Fall Recall of Precautions/Restrictions:  Intact Restrictions Weight Bearing Restrictions Per Provider Order: No      Mobility  Bed Mobility               General bed mobility comments: NT - received in recliner    Transfers Overall transfer level: Needs assistance Equipment used: Rolling walker (2 wheels) Transfers: Sit to/from Stand Sit to Stand: Contact guard assist           General transfer comment: CGA from recliner to RW - cues for hand placement    Ambulation/Gait Ambulation/Gait assistance: Contact guard assist Gait Distance (Feet): 10 Feet Assistive device: Rolling walker (2 wheels) Gait Pattern/deviations: Step-through pattern, Decreased stride length, Trunk flexed, Narrow base of support       General Gait Details: ambulated from chair to [facility] and back, 10 ft total  Stairs            Wheelchair Mobility     Tilt Bed    Modified Rankin (Stroke Patients Only)       Balance Overall balance assessment: Needs assistance Sitting-balance support: Feet supported, No upper extremity supported Sitting balance-Leahy Scale: Fair Sitting balance - Comments: able to balance in sitting without UE support   Standing balance support: Bilateral upper extremity supported, During functional activity, Reliant on assistive device for balance Standing balance-Leahy Scale: Fair Standing balance comment: reliant on AD during gait, able to stand without UE support to pull underwear down to sit on [facility]  Pertinent Vitals/Pain Pain Assessment Pain Assessment: No/denies pain    Home Living Family/patient expects to be discharged to:: Private residence Living Arrangements: Alone Available Help at Discharge: Family Type of Home: House Home Access: Stairs to enter Entrance Stairs-Rails: None Secretary/administrator of Steps: 3   Home Layout: Able to live on main level with bedroom/bathroom Home Equipment: None      Prior Function Prior Level of Function :  Independent/Modified Independent             Mobility Comments: IND with mobility - no AD ADLs Comments: IND with ADLs     Extremity/Trunk Assessment   Upper Extremity Assessment Upper Extremity Assessment: Overall WFL for tasks assessed    Lower Extremity Assessment Lower Extremity Assessment: Overall WFL for tasks assessed    Cervical / Trunk Assessment Cervical / Trunk Assessment: Kyphotic  Communication   Communication Communication: No apparent difficulties Factors Affecting Communication: Hearing impaired    Cognition Arousal: Alert Behavior During Therapy: WFL for tasks assessed/performed   PT - Cognitive impairments: No apparent impairments                         Following commands: Intact       Cueing Cueing Techniques: Verbal cues     General Comments      Exercises Other Exercises Other Exercises: edu on hand placement to RW with transfers Other Exercises: edu on the role of PT in acute care Other Exercises: completed toileting at [facility]; able to perform self-hygiene with CGA for standing at [facility]   Assessment/Plan    PT Assessment Patient needs continued PT services  PT Problem List Decreased strength;Decreased activity tolerance;Decreased balance;Decreased mobility;Decreased knowledge of use of DME       PT Treatment Interventions Gait training;DME instruction;Functional mobility training;Stair training;Therapeutic activities;Therapeutic exercise;Balance training    PT Goals (Current goals can be found in the Care Plan section)  Acute Rehab PT Goals Patient Stated Goal: to get stronger PT Goal Formulation: With patient Time For Goal Achievement: 04/25/24 Potential to Achieve Goals: Good    Frequency Min 2X/week     Co-evaluation               AM-PAC PT 6 Clicks Mobility  Outcome Measure Help needed turning from your back to your side while in a flat bed without using bedrails?: None Help needed moving from lying on  your back to sitting on the side of a flat bed without using bedrails?: None Help needed moving to and from a bed to a chair (including a wheelchair)?: A Little Help needed standing up from a chair using your arms (e.g., wheelchair or bedside chair)?: A Little Help needed to walk in hospital room?: A Little Help needed climbing 3-5 steps with a railing? : A Lot 6 Click Score: 19    End of Session Equipment Utilized During Treatment: Gait belt Activity Tolerance: Patient tolerated treatment well Patient left: in chair;with call bell/phone within reach;with nursing/sitter in room Nurse Communication: Mobility status PT Visit Diagnosis: Unsteadiness on feet (R26.81);Other abnormalities of gait and mobility (R26.89);History of falling (Z91.81);Difficulty in walking, not elsewhere classified (R26.2)    Time: 8554-8483 PT Time Calculation (min) (ACUTE ONLY): 31 min   Charges:   PT Evaluation $PT Eval Moderate Complexity: 1 Mod PT Treatments $Therapeutic Activity: 8-22 mins PT General Charges $$ ACUTE PT VISIT: 1 Visit         Allena Bulls, SPT    Allena Bulls  04/11/2024, 3:40 PM

## 2024-04-11 NOTE — Progress Notes (Signed)
 [facility] CLINIC CARDIOLOGY PROGRESS NOTE       Patient ID: Tracy Haas MRN: [id number] DOB/AGE: 1945-08-15 78 y.o.  Admit date: 04/08/2024 Referring Physician Dr. Delayne Solian Primary Physician Cleotilde Oneil FALCON, MD  Primary Cardiologist None Reason for Consultation AF RVR  HPI: Tracy Haas is a 79 y.o. female  with a past medical history of chronic HFpEF, hypertension who presented to the ED on 04/08/2024 for diarrhea and weakness. Being treated for UTI and gastroenteritis. Overnight on 04/09/24 she developed atrial fibrillation RVR. Cardiology was consulted for further evaluation.   Interval history: -Patient seen and examined this AM, sitting upright on side of bed eating breakfast.  -HR controlled, remaining in NSR. BP stable.  -Cr continues to improve.  Review of systems complete and found to be negative unless listed above    Past Medical History:  Diagnosis Date   Adult hypothyroidism 11/05/2013   Anxiety    B12 deficiency 11/07/2013   BP (high blood pressure) 02/11/2012   Diverticulitis    Family history of adverse reaction to anesthesia    mother- possible problems with intubation.   Fatty infiltration of liver 04/18/2012   Overview:  US  finding 03/2012    GERD (gastroesophageal reflux disease)    Hematuria 01/22/2012   High cholesterol    HLD (hyperlipidemia) 10/14/2012   Hypertension    Hypothyroidism    Panic attacks    Stenosis of intestine (HCC)     Past Surgical History:  Procedure Laterality Date   ABDOMINAL HYSTERECTOMY  1989   BREAST BIOPSY Right 1990's   neg   COLON RESECTION SIGMOID N/A 07/24/2016   Procedure: COLON RESECTION SIGMOID;  Surgeon: Carlin Pastel, MD;  Location: [facility] ORS;  Service: General;  Laterality: N/A;   COLONOSCOPY WITH PROPOFOL  N/A 04/16/2015   Procedure: COLONOSCOPY WITH PROPOFOL ;  Surgeon: Rogelia Copping, MD;  Location: [facility];  Service: Endoscopy;  Laterality: N/A;   CYSTOSCOPY WITH STENT PLACEMENT Bilateral 07/24/2016    Procedure: CYSTOSCOPY WITH STENT PLACEMENT;  Surgeon: Redell Lynwood Napoleon, MD;  Location: [facility] ORS;  Service: Urology;  Laterality: Bilateral;   LAPAROSCOPIC SIGMOID COLECTOMY N/A 07/24/2016   Procedure: LAPAROSCOPIC SIGMOID COLECTOMY;  Surgeon: Carlin Pastel, MD;  Location: [facility] ORS;  Service: General;  Laterality: N/A;   SMALL INTESTINE SURGERY  1989   Dr. Dellie- (After Hysterectomy)   TRIGGER FINGER RELEASE Right 04/15/2016   Procedure: RELEASE TRIGGER FINGER/A-1 PULLEY;  Surgeon: Norleen JINNY Maltos, MD;  Location: [facility];  Service: Orthopedics;  Laterality: Right;    Medications Prior to Admission  Medication Sig Dispense Refill Last Dose/Taking   bisoprolol -hydrochlorothiazide  (ZIAC ) 5-6.25 MG tablet Take 1 tablet by mouth daily.   04/07/2024   Ca Phosphate-Cholecalciferol (CALCIUM 500 + D3) 250-500 MG-UNIT CHEW Chew 2 each by mouth daily.   04/07/2024   Cyanocobalamin (RA VITAMIN B-12 TR) 1000 MCG TBCR Take 1 tablet by mouth daily.   04/07/2024   ibuprofen (ADVIL) 200 MG tablet Take 200 mg by mouth every 6 (six) hours as needed for cramping.   Unknown   potassium chloride  (KLOR-CON ) 10 MEQ tablet Take 10 mEq by mouth 2 (two) times daily.   04/07/2024   ramipril  (ALTACE ) 10 MG capsule Take 10 mg by mouth daily.    04/07/2024   simvastatin  (ZOCOR ) 20 MG tablet Take 20 mg by mouth daily.   04/07/2024   torsemide (DEMADEX) 20 MG tablet Take 10 mg by mouth once.   04/07/2024   ascorbic acid (VITAMIN C)  500 MG tablet Take 500 mg by mouth daily. (Patient not taking: Reported on 04/08/2024)   Not Taking   levothyroxine  (SYNTHROID , LEVOTHROID) 75 MCG tablet Take 1 tablet by mouth daily before breakfast.  (Patient not taking: Reported on 04/08/2024)   Not Taking   omeprazole (PRILOSEC) 20 MG capsule Take 1 capsule by mouth daily. (Patient not taking: Reported on 04/08/2024)   Not Taking   Social History   Socioeconomic History   Marital status: Widowed    Spouse name: Not on file    Number of children: Not on file   Years of education: Not on file   Highest education level: Not on file  Occupational History   Not on file  Tobacco Use   Smoking status: Never   Smokeless tobacco: Never  Substance and Sexual Activity   Alcohol use: No   Drug use: No   Sexual activity: Not on file  Other Topics Concern   Not on file  Social History Narrative   Not on file   Social Drivers of Health   Financial Resource Strain: Low Risk  (03/07/2024)   Received from [facility]   Overall Financial Resource Strain (CARDIA)    Difficulty of Paying Living Expenses: Not hard at all  Food Insecurity: No Food Insecurity (04/09/2024)   Hunger Vital Sign    Worried About Running Out of Food in the Last Year: Never true    Ran Out of Food in the Last Year: Never true  Transportation Needs: No Transportation Needs (04/09/2024)   PRAPARE - Administrator, Civil Service (Medical): No    Lack of Transportation (Non-Medical): No  Physical Activity: Not on file  Stress: Not on file  Social Connections: Socially Integrated (04/09/2024)   Social Connection and Isolation Panel    Frequency of Communication with Friends and Family: More than three times a week    Frequency of Social Gatherings with Friends and Family: More than three times a week    Attends Religious Services: More than 4 times per year    Active Member of Clubs or Organizations: Yes    Attends Banker Meetings: More than 4 times per year    Marital Status: Married  Catering Manager Violence: Not At Risk (04/09/2024)   Humiliation, Afraid, Rape, and Kick questionnaire    Fear of Current or Ex-Partner: No    Emotionally Abused: No    Physically Abused: No    Sexually Abused: No    Family History  Problem Relation Age of Onset   Hypertension Mother    Cirrhosis Mother    Hypertension Father    Diabetes Father    Kidney failure Father    Breast cancer Neg Hx      Vitals:    04/10/24 2000 04/10/24 2042 04/11/24 0013 04/11/24 0456  BP: 115/68  137/72   Pulse:  72 76 70  Resp: (!) 23  20   Temp: 97.9 F (36.6 C)  98 F (36.7 C) 97.9 F (36.6 C)  TempSrc: Oral  Oral   SpO2:  99% 96% 97%  Weight:      Height:        PHYSICAL EXAM General: Chronically ill appearing elderly female, well nourished, in no acute distress. HEENT: Normocephalic and atraumatic. Neck: No JVD.  Lungs: Normal respiratory effort on room air. Clear bilaterally to auscultation. No wheezes, crackles, rhonchi.  Heart: HRRR. Normal S1 and S2 without gallops or murmurs.  Abdomen: Non-distended  appearing.  Msk: Normal strength and tone for age. Extremities: Warm and well perfused. No clubbing, cyanosis. No edema.  Neuro: Alert and oriented X 3. Psych: Answers questions appropriately.   Labs: Basic Metabolic Panel: Recent Labs    04/10/24 0010 04/10/24 0331 04/11/24 0400  NA 138 139 139  K 3.6 3.8 3.4*  CL 104 108 107  CO2 23 21* 22  GLUCOSE 120* 112* 108*  BUN 70* 63* 35*  CREATININE 1.64* 1.46* 1.12*  CALCIUM 8.0* 7.9* 7.7*  MG 2.5* 2.4  --    Liver Function Tests: Recent Labs    04/08/24 1803  AST <10*  ALT 6  ALKPHOS 80  BILITOT 0.7  PROT 6.6  ALBUMIN 3.7   Recent Labs    04/08/24 1803  LIPASE 264*   CBC: Recent Labs    04/08/24 1803 04/09/24 1103 04/10/24 0331 04/11/24 0400  WBC 9.4   < > 6.0 4.8  NEUTROABS 7.9*  --   --   --   HGB 12.8   < > 10.1* 9.1*  HCT 36.1   < > 28.5* 27.3*  MCV 88.3   < > 88.2 94.1  PLT 197   < > 138* 134*   < > = values in this interval not displayed.   Cardiac Enzymes: No results for input(s): CKTOTAL, CKMB, CKMBINDEX, TROPONINIHS in the last 72 hours. BNP: No results for input(s): BNP in the last 72 hours. D-Dimer: No results for input(s): DDIMER in the last 72 hours. Hemoglobin A1C: No results for input(s): HGBA1C in the last 72 hours. Fasting Lipid Panel: No results for input(s): CHOL, HDL,  LDLCALC, TRIG, CHOLHDL, LDLDIRECT in the last 72 hours. Thyroid Function Tests: No results for input(s): TSH, T4TOTAL, T3FREE, THYROIDAB in the last 72 hours.  Invalid input(s): FREET3 Anemia Panel: No results for input(s): VITAMINB12, FOLATE, FERRITIN, TIBC, IRON, RETICCTPCT in the last 72 hours.   Radiology: ECHOCARDIOGRAM COMPLETE Result Date: 04/10/2024    ECHOCARDIOGRAM REPORT   Patient Name:   Tracy Haas Date of Exam: 04/10/2024 Medical Rec #:  [id number]      Height:       59.0 in Accession #:    [id number]     Weight:       109.3 lb Date of Birth:  1946/04/15     BSA:          1.427 m Patient Age:    38 years       BP:           109/68 mmHg Patient Gender: F              HR:           83 bpm. Exam Location:  [facility] Procedure: 2D Echo, Color Doppler and Cardiac Doppler (Both Spectral and Color            Flow Doppler were utilized during procedure). Indications:     Atrial Fibrillation I48.91  History:         Patient has no prior history of Echocardiogram examinations.                  Risk Factors:Hypertension and Dyslipidemia. Panic attacks.  Sonographer:     Christopher Furnace Referring Phys:  [id number] DELAYNE LULLA SOLIAN Diagnosing Phys: Annalee Custovic IMPRESSIONS  1. Left ventricular ejection fraction, by estimation, is 60 to 65%. The left ventricle has normal function. The left ventricle has no regional wall motion abnormalities. Left ventricular diastolic parameters  are consistent with Grade I diastolic dysfunction (impaired relaxation).  2. Right ventricular systolic function is normal. The right ventricular size is normal.  3. The mitral valve is normal in structure. Mild mitral valve regurgitation. No evidence of mitral stenosis.  4. Tricuspid valve regurgitation is moderate.  5. The aortic valve is normal in structure. Aortic valve regurgitation is moderate. Aortic valve sclerosis/calcification is present, without any evidence of aortic stenosis. Aortic regurgitation  PHT measures 402 msec.  6. The inferior vena cava is normal in size with greater than 50% respiratory variability, suggesting right atrial pressure of 3 mmHg. FINDINGS  Left Ventricle: Left ventricular ejection fraction, by estimation, is 60 to 65%. The left ventricle has normal function. The left ventricle has no regional wall motion abnormalities. The left ventricular internal cavity size was normal in size. There is  no left ventricular hypertrophy. Left ventricular diastolic parameters are consistent with Grade I diastolic dysfunction (impaired relaxation). Right Ventricle: The right ventricular size is normal. No increase in right ventricular wall thickness. Right ventricular systolic function is normal. Left Atrium: Left atrial size was normal in size. Right Atrium: Right atrial size was normal in size. Pericardium: There is no evidence of pericardial effusion. Mitral Valve: The mitral valve is normal in structure. Mild mitral valve regurgitation. No evidence of mitral valve stenosis. MV peak gradient, 5.8 mmHg. The mean mitral valve gradient is 2.0 mmHg. Tricuspid Valve: The tricuspid valve is normal in structure. Tricuspid valve regurgitation is moderate. Aortic Valve: The aortic valve is normal in structure. Aortic valve regurgitation is moderate. Aortic regurgitation PHT measures 402 msec. Aortic valve sclerosis/calcification is present, without any evidence of aortic stenosis. Aortic valve mean gradient measures 2.0 mmHg. Aortic valve peak gradient measures 4.2 mmHg. Aortic valve area, by VTI measures 2.42 cm. Pulmonic Valve: The pulmonic valve was normal in structure. Pulmonic valve regurgitation is mild. Aorta: The aortic root is normal in size and structure. Venous: The inferior vena cava is normal in size with greater than 50% respiratory variability, suggesting right atrial pressure of 3 mmHg. IAS/Shunts: No atrial level shunt detected by color flow Doppler.  LEFT VENTRICLE PLAX 2D LVIDd:          4.00 cm   Diastology LVIDs:         2.30 cm   LV e' medial:    11.10 cm/s LV PW:         0.90 cm   LV E/e' medial:  8.8 LV IVS:        0.90 cm   LV e' lateral:   15.70 cm/s LVOT diam:     2.00 cm   LV E/e' lateral: 6.2 LV SV:         42 LV SV Index:   29 LVOT Area:     3.14 cm LV IVRT:       95 msec  RIGHT VENTRICLE RV Basal diam:  3.50 cm     PULMONARY VEINS RV Mid diam:    2.30 cm     Diastolic Velocity: 19.40 cm/s RV S prime:     15.90 cm/s  S/D Velocity:       2.20 TAPSE (M-mode): 2.5 cm      Systolic Velocity:  41.90 cm/s LEFT ATRIUM           Index        RIGHT ATRIUM           Index LA diam:      3.10 cm 2.17  cm/m   RA Area:     15.10 cm LA Vol (A2C): 17.4 ml 12.20 ml/m  RA Volume:   36.90 ml  25.86 ml/m LA Vol (A4C): 27.4 ml 19.21 ml/m  AORTIC VALVE AV Area (Vmax):    2.78 cm AV Area (Vmean):   2.67 cm AV Area (VTI):     2.42 cm AV Vmax:           103.00 cm/s AV Vmean:          65.500 cm/s AV VTI:            0.173 m AV Peak Grad:      4.2 mmHg AV Mean Grad:      2.0 mmHg LVOT Vmax:         91.00 cm/s LVOT Vmean:        55.600 cm/s LVOT VTI:          0.133 m LVOT/AV VTI ratio: 0.77 AI PHT:            402 msec  AORTA Ao Root diam: 3.20 cm MITRAL VALVE               TRICUSPID VALVE MV Area (PHT): 5.58 cm    TR Peak grad:   51.6 mmHg MV Area VTI:   1.64 cm    TR Vmax:        359.00 cm/s MV Peak grad:  5.8 mmHg MV Mean grad:  2.0 mmHg    SHUNTS MV Vmax:       1.20 m/s    Systemic VTI:  0.13 m MV Vmean:      67.4 cm/s   Systemic Diam: 2.00 cm MV Decel Time: 136 msec MV E velocity: 98.00 cm/s MV A velocity: 66.60 cm/s MV E/A ratio:  1.47 Designer, Multimedia signed by Annalee Casa Signature Date/Time: 04/10/2024/3:18:30 PM    Final    CT ABDOMEN PELVIS WO CONTRAST Result Date: 04/08/2024 CLINICAL DATA:  Nausea/vomiting/diarrhea, found down EXAM: CT ABDOMEN AND PELVIS WITHOUT CONTRAST TECHNIQUE: Multidetector CT imaging of the abdomen and pelvis was performed following the standard  protocol without IV contrast. RADIATION DOSE REDUCTION: This exam was performed according to the departmental dose-optimization program which includes automated exposure control, adjustment of the mA and/or kV according to patient size and/or use of iterative reconstruction technique. COMPARISON:  02/19/2023 FINDINGS: Lower chest: No acute pleural or parenchymal lung disease. Hepatobiliary: Unremarkable unenhanced appearance of the liver and gallbladder. Pancreas: Unremarkable unenhanced appearance. Spleen: Unremarkable unenhanced appearance. Adrenals/Urinary Tract: No urinary tract calculi or obstructive uropathy within either kidney. The adrenals are unremarkable. Bladder is grossly normal. Stomach/Bowel: Postsurgical changes from prior small bowel resection and partial sigmoid colon resection. No bowel obstruction or ileus. No bowel wall thickening or inflammatory change. Vascular/Lymphatic: Aortic atherosclerosis. No enlarged abdominal or pelvic lymph nodes. Reproductive: Status post hysterectomy. No adnexal masses. Other: No free fluid or free intraperitoneal gas. No abdominal wall hernia. Musculoskeletal: No acute or destructive bony abnormalities. Reconstructed images demonstrate no additional findings. IMPRESSION: 1. No acute intra-abdominal or intrapelvic process. 2.  Aortic Atherosclerosis (ICD10-I70.0). Electronically Signed   By: Ozell Daring M.D.   On: 04/08/2024 20:01    ECHO as above  TELEMETRY (personally reviewed): NSR PVCs rate 80s  EKG (personally reviewed): AF RVR PVCs rate 174 bpm  Data reviewed by me 04/11/2024: last 24h vitals tele labs imaging I/O ED provider note, admission H&P, hospitalist progress note  Principal Problem:   AKI (acute kidney injury) Active  Problems:   Essential hypertension   Urinary tract infection   Acute gastroenteritis   Chronic heart failure with preserved ejection fraction (HFpEF) (HCC)   Generalized weakness   Elevated lipase   High anion gap  metabolic acidosis   Possible A-fib on EKG, with PVCs    ASSESSMENT AND PLAN:  Tracy Haas is a 78 y.o. female  with a past medical history of chronic HFpEF, hypertension who presented to the ED on 04/08/2024 for diarrhea and weakness. Being treated for UTI and gastroenteritis. Overnight on 04/09/24 she developed atrial fibrillation RVR. Cardiology was consulted for further evaluation.   # Atrial fibrillation RVR # New onset atrial fibrillation # UTI, gastroenteritis # Chronic HFpEF # Hypertension Patient initially presented with complaints of weakness, diarrhea. Admitted for gastroenteritis, UTI. Overnight 04/09/24 developed AF RVR and started on IV amiodarone . Converted to NSR around 0730 on 04/10/24. Echo this admission with EF 60-65%, no WMAs, grade I diastolic dysfunction, mild MR, moderate TR, both atria normal in size. -Transition to PO amiodarone  load with 400 mg BID for 10 days followed by 200 mg daily. -Continue eliquis  5 mg twice daily. -Continue to treat causes of increased adrenergic tone including fever, infection, hypoxia, pain, etc.    This patient's plan of care was discussed and created with Dr. Custovic and she is in agreement.  Signed: Danita Bloch, PA-C  04/11/2024, 9:57 AM [facility] Cardiology

## 2024-04-11 NOTE — Plan of Care (Signed)

## 2024-04-11 NOTE — Progress Notes (Signed)
 Progress Note   Patient: Tracy Haas DOB: 02-10-1946 DOA: 04/08/2024     2 DOS: the patient was seen and examined on 04/11/2024   Brief hospital course:  Tracy Haas is a 78 y.o. female with medical history significant for Hypertension, HFpEF with moderate AI and MR, being admitted with urinary tract infection and acute kidney injury in the setting of acute enteritis.  Patient has been having  diarrhea for the past week with worsening weakness.  On the day of arrival, she was found on the floor by her son awake and alert.  States she was so weak she lowered herself to the floor after using the bathroom.  Denies falling or hitting her head  In the ED BP 127/48 and vitals WNL Labs notable for normal WBC and otherwise normal CBC Lipase 264 with normal LFTs Creatinine 2.89 up from baseline of 0.9 a month ago with metabolic acidosis with bicarb of 10 and anion gap of 19 Troponin 35 EKG showed possible A-fib at 95 with PVCs CT abdomen and pelvis nonacute UA consistent with UTI Patient treated with an LR bolus and Rocephin  Admission requested      Assessment and Plan:  Paroxysmal A-fib (new onset) Patient developed rapid A-fib overnight 11/23 with heart rates between 130 and 180 Started on an amiodarone  drip and converted to sinus rhythm on 11/24 Patient has a CHADS2VASC score of 4 and requires long-term anticoagulation as primary prophylaxis for an acute stroke Continue Eliquis  5 mg twice daily Patient was on amiodarone  drip and has been switched to oral Appreciate cardiology input 2D echocardiogram shows a normal LVEF of 60 to 65% with grade 1 diastolic dysfunction.  Patient noted to have moderate aortic valve and tricuspid valve regurgitation        AKI (acute kidney injury) High anion gap metabolic acidosis Secondary to GI losses from diarrhea resulting in ATN with concomitant diuretic and ACE inhibitor use On admission, patient noted to have creatinine 2.89  up from baseline of 0.9, bicarb 10 and anion gap 19.  BUN elevated at 103 Patient received bicarbonate infusion with improvement in her serum creatinine to 1.12 and resolution of her metabolic acidosis Hep-Lock IV fluid and encourage oral fluid intake       Myocardial injury Secondary to demand ischemia from tachycardia Troponin levels are flat and not concerning for an acute coronary syndrome 2D echocardiogram shows a normal LVEF with no evidence of regional wall motion abnormality       Urinary tract infection Patient noted to have pyuria on admission She had complaints of frequency and dysuria Urine culture yielded greater than 100,000 colonies of E. coli sensitive to cephalosporins Patient has received 4 days of IV Rocephin  and will complete a 5-day course with oral Keflex          Acute gastroenteritis (Resolved) Elevated lipase No further episodes of diarrhea Elevated lipase appears to be reactive and secondary to acute gastroenteritis CT scan of abdomen and pelvis showed no acute findings       Generalized weakness Secondary to UTI, AKI Expecting improvement with treatment Fall precautions Awaiting PT evaluation     Chronic heart failure with preserved ejection fraction (HFpEF) (HCC) Patient clinically dry Will resume bisoprolol  Continue to hold HCTZ, furosemide and ramipril  due to AKI      Essential hypertension Resume bisoprolol      Hypokalemia/hypomagnesemia Secondary to GI losses Supplement potassium and magnesium       Subjective: No further episodes of diarrhea.  Feels better  Physical Exam: Vitals:   04/11/24 0013 04/11/24 0456 04/11/24 0900 04/11/24 1155  BP: 137/72  (!) 146/79 (!) 147/77  Pulse: 76 70  75  Resp: 20  20   Temp: 98 F (36.7 C) 97.9 F (36.6 C)  98.3 F (36.8 C)  TempSrc: Oral   Oral  SpO2: 96% 97%  98%  Weight:      Height:       Vitals and nursing note reviewed.  Constitutional:      General: She is not in acute  distress.  Generalized weakness HENT:     Head: Normocephalic and atraumatic.  Cardiovascular:     Rate and Rhythm: Normal rate and regular rhythm.     Heart sounds: Normal heart sounds.  Pulmonary:     Effort: Pulmonary effort is normal.     Breath sounds: Normal breath sounds.  Abdominal:     Palpations: Abdomen is soft.     Tenderness: There is no abdominal tenderness.  Neurological:     Mental Status: Mental status is at baseline.  Oriented to person, place and time    Data Reviewed: Labs reviewed.  Magnesium  1.6, potassium 3.4, BUN 35, creatinine 1.12, hemoglobin 9.1 Labs reviewed  Family Communication: Plan of care discussed with patient at the bedside.  She verbalizes understanding and agrees with the plan  Disposition: Status is: Inpatient Remains inpatient appropriate because: Awaiting PT evaluation  Planned Discharge Destination: TBD    Time spent: 40 minutes  Author: Aimee Somerset, MD 04/11/2024 1:35 PM  For on call review [email or web address].

## 2024-04-11 NOTE — Plan of Care (Signed)

## 2024-04-11 NOTE — Care Management Important Message (Signed)
 Important Message  Patient Details  Name: Tracy Haas MRN: [id number] Date of Birth: 1945-07-22   Important Message Given:  Yes - Medicare IM     [name] 04/11/2024, 1:57 PM

## 2024-04-12 ENCOUNTER — Other Ambulatory Visit: Payer: Self-pay

## 2024-04-12 DIAGNOSIS — N390 Urinary tract infection, site not specified: Secondary | ICD-10-CM | POA: Diagnosis not present

## 2024-04-12 DIAGNOSIS — E8729 Other acidosis: Secondary | ICD-10-CM | POA: Diagnosis not present

## 2024-04-12 DIAGNOSIS — R9431 Abnormal electrocardiogram [ECG] [EKG]: Secondary | ICD-10-CM

## 2024-04-12 DIAGNOSIS — I10 Essential (primary) hypertension: Secondary | ICD-10-CM | POA: Diagnosis not present

## 2024-04-12 DIAGNOSIS — R748 Abnormal levels of other serum enzymes: Secondary | ICD-10-CM

## 2024-04-12 DIAGNOSIS — R531 Weakness: Secondary | ICD-10-CM

## 2024-04-12 DIAGNOSIS — K529 Noninfective gastroenteritis and colitis, unspecified: Secondary | ICD-10-CM

## 2024-04-12 DIAGNOSIS — N179 Acute kidney failure, unspecified: Secondary | ICD-10-CM | POA: Diagnosis not present

## 2024-04-12 DIAGNOSIS — I5032 Chronic diastolic (congestive) heart failure: Secondary | ICD-10-CM

## 2024-04-12 LAB — CBC
HCT: 27.6 % — ABNORMAL LOW (ref 36.0–46.0)
Hemoglobin: 9.3 g/dL — ABNORMAL LOW (ref 12.0–15.0)
MCH: 30.9 pg (ref 26.0–34.0)
MCHC: 33.7 g/dL (ref 30.0–36.0)
MCV: 91.7 fL (ref 80.0–100.0)
Platelets: 128 K/uL — ABNORMAL LOW (ref 150–400)
RBC: 3.01 MIL/uL — ABNORMAL LOW (ref 3.87–5.11)
RDW: 13.1 % (ref 11.5–15.5)
WBC: 6.4 K/uL (ref 4.0–10.5)
nRBC: 0 % (ref 0.0–0.2)

## 2024-04-12 LAB — BASIC METABOLIC PANEL WITH GFR
Anion gap: 9 (ref 5–15)
BUN: 21 mg/dL (ref 8–23)
CO2: 22 mmol/L (ref 22–32)
Calcium: 7.9 mg/dL — ABNORMAL LOW (ref 8.9–10.3)
Chloride: 106 mmol/L (ref 98–111)
Creatinine, Ser: 0.83 mg/dL (ref 0.44–1.00)
GFR, Estimated: >60 mL/min (ref >60)
Glucose, Bld: 103 mg/dL — ABNORMAL HIGH (ref 70–99)
Potassium: 4.5 mmol/L (ref 3.5–5.1)
Sodium: 136 mmol/L (ref 135–145)

## 2024-04-12 LAB — URINE CULTURE: Culture: >=100000 — AB

## 2024-04-12 NOTE — Progress Notes (Signed)
 [facility] CARDIOLOGY PROGRESS NOTE       Patient ID: Tracy Haas MRN: [id number] DOB/AGE: [birth year] 78 y.o.  Admit date: 04/08/2024 Referring Physician Dr. Delayne Solian Primary Physician Cleotilde Oneil FALCON, MD  Primary Cardiologist None Reason for Consultation AF RVR  HPI: Tracy Haas is a 78 y.o. female  with a past medical history of chronic HFpEF, hypertension who presented to the ED on 04/08/2024 for diarrhea and weakness. Being treated for UTI and gastroenteritis. Overnight on 04/09/24 she developed atrial fibrillation RVR. Cardiology was consulted for further evaluation.   Interval history: -Patient seen and examined this AM, resting comfortably in hospital bed. -HR controlled, remaining in NSR. BP stable.  -Cr continues to improve.  Review of systems complete and found to be negative unless listed above    Past Medical History:  Diagnosis Date   Adult hypothyroidism 11/05/2013   Anxiety    B12 deficiency 11/07/2013   BP (high blood pressure) 02/11/2012   Diverticulitis    Family history of adverse reaction to anesthesia    mother- possible problems with intubation.   Fatty infiltration of liver 04/18/2012   Overview:  US  finding 03/2012    GERD (gastroesophageal reflux disease)    Hematuria 01/22/2012   High cholesterol    HLD (hyperlipidemia) 10/14/2012   Hypertension    Hypothyroidism    Panic attacks    Stenosis of intestine (HCC)     Past Surgical History:  Procedure Laterality Date   ABDOMINAL HYSTERECTOMY  1989   BREAST BIOPSY Right 1990's   neg   COLON RESECTION SIGMOID N/A 07/24/2016   Procedure: COLON RESECTION SIGMOID;  Surgeon: Carlin Pastel, MD;  Location: [facility] ORS;  Service: General;  Laterality: N/A;   COLONOSCOPY WITH PROPOFOL  N/A 04/16/2015   Procedure: COLONOSCOPY WITH PROPOFOL ;  Surgeon: Rogelia Copping, MD;  Location: [facility];  Service: Endoscopy;  Laterality: N/A;   CYSTOSCOPY WITH STENT PLACEMENT Bilateral 07/24/2016   Procedure:  CYSTOSCOPY WITH STENT PLACEMENT;  Surgeon: Redell Lynwood Napoleon, MD;  Location: [facility];  Service: Urology;  Laterality: Bilateral;   LAPAROSCOPIC SIGMOID COLECTOMY N/A 07/24/2016   Procedure: LAPAROSCOPIC SIGMOID COLECTOMY;  Surgeon: Carlin Pastel, MD;  Location: [facility] OR[facility];  Service: General;  Laterality: N/A;   SMALL INTESTINE SURGERY  1989   Dr. Dellie- (After Hysterectomy)   TRIGGER FINGER RELEASE Right 04/15/2016   Procedure: RELEASE TRIGGER FINGER/A-1 PULLEY;  Surgeon: Norleen JINNY Maltos, MD;  Location: [facility];  Service: Orthopedics;  Laterality: Right;    Medications Prior to Admission  Medication Sig Dispense Refill Last Dose/Taking   bisoprolol -hydrochlorothiazide  (ZIAC ) 5-6.25 MG tablet Take 1 tablet by mouth daily.   04/07/2024   Ca Phosphate-Cholecalciferol (CALCIUM 500 + D3) 250-500 MG-UNIT CHEW Chew 2 each by mouth daily.   04/07/2024   Cyanocobalamin (RA VITAMIN B-12 TR) 1000 MCG TBCR Take 1 tablet by mouth daily.   04/07/2024   ibuprofen (ADVIL) 200 MG tablet Take 200 mg by mouth every 6 (six) hours as needed for cramping.   Unknown   potassium chloride  (KLOR-CON ) 10 MEQ tablet Take 10 mEq by mouth 2 (two) times daily.   04/07/2024   ramipril  (ALTACE ) 10 MG capsule Take 10 mg by mouth daily.    04/07/2024   simvastatin  (ZOCOR ) 20 MG tablet Take 20 mg by mouth daily.   04/07/2024   torsemide (DEMADEX) 20 MG tablet Take 10 mg by mouth once.   04/07/2024   ascorbic acid (VITAMIN C) 500 MG tablet Take  500 mg by mouth daily. (Patient not taking: Reported on 04/08/2024)   Not Taking   levothyroxine  (SYNTHROID , LEVOTHROID) 75 MCG tablet Take 1 tablet by mouth daily before breakfast.  (Patient not taking: Reported on 04/08/2024)   Not Taking   omeprazole (PRILOSEC) 20 MG capsule Take 1 capsule by mouth daily. (Patient not taking: Reported on 04/08/2024)   Not Taking   Social History   Socioeconomic History   Marital status: Widowed    Spouse name: Not on file   Number of  children: Not on file   Years of education: Not on file   Highest education level: Not on file  Occupational History   Not on file  Tobacco Use   Smoking status: Never   Smokeless tobacco: Never  Substance and Sexual Activity   Alcohol use: No   Drug use: No   Sexual activity: Not on file  Other Topics Concern   Not on file  Social History Narrative   Not on file   Social Drivers of Health   Financial Resource Strain: Low Risk  (03/07/2024)   Received from [facility]   Overall Financial Resource Strain (CARDIA)    Difficulty of Paying Living Expenses: Not hard at all  Food Insecurity: No Food Insecurity (04/09/2024)   Hunger Vital Sign    Worried About Running Out of Food in the Last Year: Never true    Ran Out of Food in the Last Year: Never true  Transportation Needs: No Transportation Needs (04/09/2024)   PRAPARE - Administrator, Civil Service (Medical): No    Lack of Transportation (Non-Medical): No  Physical Activity: Not on file  Stress: Not on file  Social Connections: Socially Integrated (04/09/2024)   Social Connection and Isolation Panel    Frequency of Communication with Friends and Family: More than three times a week    Frequency of Social Gatherings with Friends and Family: More than three times a week    Attends Religious Services: More than 4 times per year    Active Member of Clubs or Organizations: Yes    Attends Banker Meetings: More than 4 times per year    Marital Status: Married  Catering Manager Violence: Not At Risk (04/09/2024)   Humiliation, Afraid, Rape, and Kick questionnaire    Fear of Current or Ex-Partner: No    Emotionally Abused: No    Physically Abused: No    Sexually Abused: No    Family History  Problem Relation Age of Onset   Hypertension Mother    Cirrhosis Mother    Hypertension Father    Diabetes Father    Kidney failure Father    Breast cancer Neg Hx      Vitals:   04/11/24  1400 04/11/24 1617 04/11/24 2008 04/12/24 0000  BP: (!) 142/59 124/67 120/66 (!) 118/58  Pulse:  61 63 65  Resp: 20 20 17 17   Temp:  97.8 F (36.6 C) 97.8 F (36.6 C) 97.8 F (36.6 C)  TempSrc:   Oral Oral  SpO2:  98% 98% 97%  Weight:      Height:        PHYSICAL EXAM General: Chronically ill appearing elderly female, well nourished, in no acute distress. HEENT: Normocephalic and atraumatic. Neck: No JVD.  Lungs: Normal respiratory effort on room air. Clear bilaterally to auscultation. No wheezes, crackles, rhonchi.  Heart: HRRR. Normal S1 and S2 without gallops or murmurs.  Abdomen: Non-distended appearing.  Msk:  Normal strength and tone for age. Extremities: Warm and well perfused. No clubbing, cyanosis. No edema.  Neuro: Alert and oriented X 3. Psych: Answers questions appropriately.   Labs: Basic Metabolic Panel: Recent Labs    04/10/24 0331 04/11/24 0400 04/11/24 0841 04/12/24 0410  NA 139 139  --  136  K 3.8 3.4*  --  4.5  CL 108 107  --  106  CO2 21* 22  --  22  GLUCOSE 112* 108*  --  103*  BUN 63* 35*  --  21  CREATININE 1.46* 1.12*  --  0.83  CALCIUM 7.9* 7.7*  --  7.9*  MG 2.4  --  1.6*  --    Liver Function Tests: No results for input(s): AST, ALT, ALKPHOS, BILITOT, PROT, ALBUMIN in the last 72 hours.  No results for input(s): LIPASE, AMYLASE in the last 72 hours.  CBC: Recent Labs    04/11/24 0400 04/12/24 0410  WBC 4.8 6.4  HGB 9.1* 9.3*  HCT 27.3* 27.6*  MCV 94.1 91.7  PLT 134* 128*   Cardiac Enzymes: No results for input(s): CKTOTAL, CKMB, CKMBINDEX, TROPONINIHS in the last 72 hours. BNP: No results for input(s): BNP in the last 72 hours. D-Dimer: No results for input(s): DDIMER in the last 72 hours. Hemoglobin A1C: No results for input(s): HGBA1C in the last 72 hours. Fasting Lipid Panel: No results for input(s): CHOL, HDL, LDLCALC, TRIG, CHOLHDL, LDLDIRECT in the last 72 hours. Thyroid  Function Tests: No results for input(s): TSH, T4TOTAL, T3FREE, THYROIDAB in the last 72 hours.  Invalid input(s): FREET3 Anemia Panel: No results for input(s): VITAMINB12, FOLATE, FERRITIN, TIBC, IRON, RETICCTPCT in the last 72 hours.   Radiology: ECHOCARDIOGRAM COMPLETE Result Date: 04/10/2024    ECHOCARDIOGRAM REPORT   Patient Name:   Tracy Haas Date of Exam: 04/10/2024 Medical Rec #:  [id number]      Height:       59.0 in Accession #:    [id number]     Weight:       109.3 lb Date of Birth:  04/03/1946     BSA:          1.427 m Patient Age:    22 years       BP:           109/68 mmHg Patient Gender: F              HR:           83 bpm. Exam Location:  [facility] Procedure: 2D Echo, Color Doppler and Cardiac Doppler (Both Spectral and Color            Flow Doppler were utilized during procedure). Indications:     Atrial Fibrillation I48.91  History:         Patient has no prior history of Echocardiogram examinations.                  Risk Factors:Hypertension and Dyslipidemia. Panic attacks.  Sonographer:     Christopher Furnace Referring Phys:  [id number] DELAYNE LULLA SOLIAN Diagnosing Phys: Annalee Custovic IMPRESSIONS  1. Left ventricular ejection fraction, by estimation, is 60 to 65%. The left ventricle has normal function. The left ventricle has no regional wall motion abnormalities. Left ventricular diastolic parameters are consistent with Grade I diastolic dysfunction (impaired relaxation).  2. Right ventricular systolic function is normal. The right ventricular size is normal.  3. The mitral valve is normal in structure. Mild mitral valve regurgitation. No  evidence of mitral stenosis.  4. Tricuspid valve regurgitation is moderate.  5. The aortic valve is normal in structure. Aortic valve regurgitation is moderate. Aortic valve sclerosis/calcification is present, without any evidence of aortic stenosis. Aortic regurgitation PHT measures 402 msec.  6. The inferior vena cava is normal in size with  greater than 50% respiratory variability, suggesting right atrial pressure of 3 mmHg. FINDINGS  Left Ventricle: Left ventricular ejection fraction, by estimation, is 60 to 65%. The left ventricle has normal function. The left ventricle has no regional wall motion abnormalities. The left ventricular internal cavity size was normal in size. There is  no left ventricular hypertrophy. Left ventricular diastolic parameters are consistent with Grade I diastolic dysfunction (impaired relaxation). Right Ventricle: The right ventricular size is normal. No increase in right ventricular wall thickness. Right ventricular systolic function is normal. Left Atrium: Left atrial size was normal in size. Right Atrium: Right atrial size was normal in size. Pericardium: There is no evidence of pericardial effusion. Mitral Valve: The mitral valve is normal in structure. Mild mitral valve regurgitation. No evidence of mitral valve stenosis. MV peak gradient, 5.8 mmHg. The mean mitral valve gradient is 2.0 mmHg. Tricuspid Valve: The tricuspid valve is normal in structure. Tricuspid valve regurgitation is moderate. Aortic Valve: The aortic valve is normal in structure. Aortic valve regurgitation is moderate. Aortic regurgitation PHT measures 402 msec. Aortic valve sclerosis/calcification is present, without any evidence of aortic stenosis. Aortic valve mean gradient measures 2.0 mmHg. Aortic valve peak gradient measures 4.2 mmHg. Aortic valve area, by VTI measures 2.42 cm. Pulmonic Valve: The pulmonic valve was normal in structure. Pulmonic valve regurgitation is mild. Aorta: The aortic root is normal in size and structure. Venous: The inferior vena cava is normal in size with greater than 50% respiratory variability, suggesting right atrial pressure of 3 mmHg. IAS/Shunts: No atrial level shunt detected by color flow Doppler.  LEFT VENTRICLE PLAX 2D LVIDd:         4.00 cm   Diastology LVIDs:         2.30 cm   LV e' medial:    11.10 cm/s  LV PW:         0.90 cm   LV E/e' medial:  8.8 LV IVS:        0.90 cm   LV e' lateral:   15.70 cm/s LVOT diam:     2.00 cm   LV E/e' lateral: 6.2 LV SV:         42 LV SV Index:   29 LVOT Area:     3.14 cm LV IVRT:       95 msec  RIGHT VENTRICLE RV Basal diam:  3.50 cm     PULMONARY VEINS RV Mid diam:    2.30 cm     Diastolic Velocity: 19.40 cm/s RV S prime:     15.90 cm/s  S/D Velocity:       2.20 TAPSE (M-mode): 2.5 cm      Systolic Velocity:  41.90 cm/s LEFT ATRIUM           Index        RIGHT ATRIUM           Index LA diam:      3.10 cm 2.17 cm/m   RA Area:     15.10 cm LA Vol (A2C): 17.4 ml 12.20 ml/m  RA Volume:   36.90 ml  25.86 ml/m LA Vol (A4C): 27.4 ml 19.21 ml/m  AORTIC  VALVE AV Area (Vmax):    2.78 cm AV Area (Vmean):   2.67 cm AV Area (VTI):     2.42 cm AV Vmax:           103.00 cm/s AV Vmean:          65.500 cm/s AV VTI:            0.173 m AV Peak Grad:      4.2 mmHg AV Mean Grad:      2.0 mmHg LVOT Vmax:         91.00 cm/s LVOT Vmean:        55.600 cm/s LVOT VTI:          0.133 m LVOT/AV VTI ratio: 0.77 AI PHT:            402 msec  AORTA Ao Root diam: 3.20 cm MITRAL VALVE               TRICUSPID VALVE MV Area (PHT): 5.58 cm    TR Peak grad:   51.6 mmHg MV Area VTI:   1.64 cm    TR Vmax:        359.00 cm/s MV Peak grad:  5.8 mmHg MV Mean grad:  2.0 mmHg    SHUNTS MV Vmax:       1.20 m/s    Systemic VTI:  0.13 m MV Vmean:      67.4 cm/s   Systemic Diam: 2.00 cm MV Decel Time: 136 msec MV E velocity: 98.00 cm/s MV A velocity: 66.60 cm/s MV E/A ratio:  1.47 Designer, Multimedia signed by Annalee Casa Signature Date/Time: 04/10/2024/3:18:30 PM    Final    CT ABDOMEN PELVIS WO CONTRAST Result Date: 04/08/2024 CLINICAL DATA:  Nausea/vomiting/diarrhea, found down EXAM: CT ABDOMEN AND PELVIS WITHOUT CONTRAST TECHNIQUE: Multidetector CT imaging of the abdomen and pelvis was performed following the standard protocol without IV contrast. RADIATION DOSE REDUCTION: This exam was performed  according to the departmental dose-optimization program which includes automated exposure control, adjustment of the mA and/or kV according to patient size and/or use of iterative reconstruction technique. COMPARISON:  02/19/2023 FINDINGS: Lower chest: No acute pleural or parenchymal lung disease. Hepatobiliary: Unremarkable unenhanced appearance of the liver and gallbladder. Pancreas: Unremarkable unenhanced appearance. Spleen: Unremarkable unenhanced appearance. Adrenals/Urinary Tract: No urinary tract calculi or obstructive uropathy within either kidney. The adrenals are unremarkable. Bladder is grossly normal. Stomach/Bowel: Postsurgical changes from prior small bowel resection and partial sigmoid colon resection. No bowel obstruction or ileus. No bowel wall thickening or inflammatory change. Vascular/Lymphatic: Aortic atherosclerosis. No enlarged abdominal or pelvic lymph nodes. Reproductive: Status post hysterectomy. No adnexal masses. Other: No free fluid or free intraperitoneal gas. No abdominal wall hernia. Musculoskeletal: No acute or destructive bony abnormalities. Reconstructed images demonstrate no additional findings. IMPRESSION: 1. No acute intra-abdominal or intrapelvic process. 2.  Aortic Atherosclerosis (ICD10-I70.0). Electronically Signed   By: Ozell Daring M.D.   On: 04/08/2024 20:01    ECHO as above  TELEMETRY (personally reviewed): NSR PVCs rate 80s  EKG (personally reviewed): AF RVR PVCs rate 174 bpm  Data reviewed by me 04/12/2024: last 24h vitals tele labs imaging I/O ED provider note, admission H&P, hospitalist progress note  Principal Problem:   AKI (acute kidney injury) Active Problems:   Essential hypertension   Urinary tract infection   Acute gastroenteritis   Chronic heart failure with preserved ejection fraction (HFpEF) (HCC)   Generalized weakness   Elevated lipase   High anion  gap metabolic acidosis   Possible A-fib on EKG, with PVCs    ASSESSMENT AND PLAN:   Tracy Haas is a 78 y.o. female  with a past medical history of chronic HFpEF, hypertension who presented to the ED on 04/08/2024 for diarrhea and weakness. Being treated for UTI and gastroenteritis. Overnight on 04/09/24 she developed atrial fibrillation RVR. Cardiology was consulted for further evaluation.   # Atrial fibrillation RVR # New onset atrial fibrillation # UTI, gastroenteritis # Chronic HFpEF # Hypertension Patient initially presented with complaints of weakness, diarrhea. Admitted for gastroenteritis, UTI. Overnight 04/09/24 developed AF RVR and started on IV amiodarone . Converted to NSR around 0730 on 04/10/24. Echo this admission with EF 60-65%, no WMAs, grade I diastolic dysfunction, mild MR, moderate TR, both atria normal in size. - Continue PO amiodarone  load with 400 mg BID for 10 days followed by 200 mg daily. -Continue eliquis  5 mg twice daily. -Continue to treat causes of increased adrenergic tone including fever, infection, hypoxia, pain, etc.    Cardiology will sign off. Please haiku with questions or re-engage if needed. Follow up with Dr. Custovic in 1-2 weeks.   This patient's plan of care was discussed and created with Dr. Custovic and she is in agreement.  Signed: Danita Bloch, PA-C  04/12/2024, 8:32 AM [facility] Cardiology

## 2024-04-12 NOTE — Plan of Care (Signed)

## 2024-04-12 NOTE — Plan of Care (Signed)
  Problem: Clinical Measurements: Goal: Ability to maintain clinical measurements within normal limits will improve Outcome: Progressing   Problem: Safety: Goal: Ability to remain free from injury will improve Outcome: Progressing   Problem: Elimination: Goal: Will not experience complications related to bowel motility Outcome: Progressing

## 2024-04-12 NOTE — Progress Notes (Signed)
 Physical Therapy Treatment Patient Details Name: Tracy Haas MRN: [id number] DOB: 12-Dec-1945 Today's Date: 04/12/2024   History of Present Illness Tracy Haas is a 78 y.o. female with medical history significant for Hypertension, HFpEF with moderate AI and MR, being admitted with urinary tract infection and acute kidney injury in the setting of acute enteritis.  Patient has been having  diarrhea for the past week with worsening weakness.  On the day of arrival, she was found on the floor by her son awake and alert.  States she was so weak she lowered herself to the floor after using the bathroom.  Denies falling or hitting her head.    PT Comments  Patient resting in bed upon arrival to session; agreeable to session with min encouragement.  Demonstrates partially reciprocal stepping pattern; generally shuffling gait pattern with very slow, deliberate and effortful stepping pattern. Patient very cautious and hesitant with mobility efforts; consistent cuing/encouragement throughout  Activity tolerance, gait distance generally improved; however, unable to demonstrate ability to complete all tasks necessary for safe return home alone.  DC recs remain appropriate at this time.      If plan is discharge home, recommend the following: A little help with walking and/or transfers;A little help with bathing/dressing/bathroom   Can travel by private vehicle     Yes  Equipment Recommendations  Rolling walker (2 wheels);BSC/3in1    Recommendations for Other Services       Precautions / Restrictions Precautions Precautions: Fall Restrictions Weight Bearing Restrictions Per Provider Order: No     Mobility  Bed Mobility Overal bed mobility: Needs Assistance Bed Mobility: Supine to Sit, Sit to Supine     Supine to sit: Min assist Sit to supine: Min assist   General bed mobility comments: assist for LE management    Transfers Overall transfer level: Needs assistance Equipment used:  Rolling walker (2 wheels) Transfers: Sit to/from Stand Sit to Stand: Contact guard assist                Ambulation/Gait Ambulation/Gait assistance: Contact guard assist Gait Distance (Feet): 60 Feet Assistive device: Rolling walker (2 wheels)         General Gait Details: partially reciprocal stepping pattern; generally shuffling gait pattern with very slow, deliberate and effortful stepping pattern.  Patient very cautious and hesitant with mobility efforts; consistent cuing/encouragement throughout   Stairs             Wheelchair Mobility     Tilt Bed    Modified Rankin (Stroke Patients Only)       Balance Overall balance assessment: Needs assistance Sitting-balance support: No upper extremity supported, Feet supported Sitting balance-Leahy Scale: Good     Standing balance support: Bilateral upper extremity supported Standing balance-Leahy Scale: Fair                              Hotel Manager: Impaired Factors Affecting Communication: Hearing impaired  Cognition Arousal: Alert Behavior During Therapy: WFL for tasks assessed/performed                           PT - Cognition Comments: mild delay in processing   Following commands impaired: Follows one step commands with increased time    Cueing Cueing Techniques: Verbal cues  Exercises      General Comments        Pertinent Vitals/Pain Pain Assessment Pain Assessment:  No/denies pain    Home Living Family/patient expects to be discharged to:: Private residence Living Arrangements: Alone Available Help at Discharge: Family Type of Home: House Home Access: Stairs to enter Entrance Stairs-Rails: None Entrance Stairs-Number of Steps: 3   Home Layout: Able to live on main level with bedroom/bathroom Home Equipment: None      Prior Function            PT Goals (current goals can now be found in the care plan section) Acute Rehab  PT Goals Patient Stated Goal: to get stronger PT Goal Formulation: With patient Time For Goal Achievement: 04/25/24 Potential to Achieve Goals: Good Progress towards PT goals: Progressing toward goals    Frequency    Min 2X/week      PT Plan      Co-evaluation              AM-PAC PT 6 Clicks Mobility   Outcome Measure  Help needed turning from your back to your side while in a flat bed without using bedrails?: None Help needed moving from lying on your back to sitting on the side of a flat bed without using bedrails?: None Help needed moving to and from a bed to a chair (including a wheelchair)?: A Little Help needed standing up from a chair using your arms (e.g., wheelchair or bedside chair)?: A Little Help needed to walk in hospital room?: A Little Help needed climbing 3-5 steps with a railing? : A Lot 6 Click Score: 19    End of Session Equipment Utilized During Treatment: Gait belt Activity Tolerance: Patient tolerated treatment well Patient left: in bed;with call bell/phone within reach;with bed alarm set Nurse Communication: Mobility status PT Visit Diagnosis: Unsteadiness on feet (R26.81);Other abnormalities of gait and mobility (R26.89);History of falling (Z91.81);Difficulty in walking, not elsewhere classified (R26.2)     Time: 8484-8462 PT Time Calculation (min) (ACUTE ONLY): 22 min  Charges:    $Gait Training: 8-22 mins PT General Charges $$ ACUTE PT VISIT: 1 Visit                    Ajmal Kathan H. Delores, PT, DPT, NCS 04/12/24, 5:29 PM [phone]

## 2024-04-12 NOTE — TOC Initial Note (Addendum)
 Transition of Care [facility]) - Initial/Assessment Note    Patient Details  Name: Tracy Haas MRN: [id number] Date of Birth: Oct 30, 1945  Transition of Care [facility]) CM/SW Contact:    Shasta DELENA Daring, RN Phone Number: 04/12/2024, 11:30 AM  Clinical Narrative:                 RNCM assessed patient. She was alone in her room. Patient is amenable to SNF for rehab. Stated her son lives in [place]. She lives alone is a single family home. Prior to illness she would drive herself to appointments. Confirmed Dr. Oneil Pinal is PCP. Uses Adult Nurse on [employer]. Denies difficulty affording medication. No HH services or DME in home.  RNCM will complete FL2 and initiate SNF search.   1:42 PM FL2 complete  Expected Discharge Plan: Skilled Nursing Facility Barriers to Discharge: Continued Medical Work up   Patient Goals and CMS Choice Patient states their goals for this hospitalization and ongoing recovery are:: get strong enough to go back home          Expected Discharge Plan and Services In-house Referral: Clinical Social Work                                            Prior Living Arrangements/Services   Lives with:: Self (single family home) Patient language and need for interpreter reviewed:: Yes        Need for Family Participation in Patient Care: Yes (Comment) Care giver support system in place?: Yes (comment)   Criminal Activity/Legal Involvement Pertinent to Current Situation/Hospitalization: No - Comment as needed  Activities of Daily Living   ADL Screening (condition at time of admission) Independently performs ADLs?: Yes (appropriate for developmental age) Is the patient deaf or have difficulty hearing?: Yes Does the patient have difficulty seeing, even when wearing glasses/contacts?: No Does the patient have difficulty concentrating, remembering, or making decisions?: No  Permission Sought/Granted Permission sought to share information with :  Family Supports Permission granted to share information with : Yes, Verbal Permission Granted  Share Information with NAME: Roda Lauture, son           Emotional Assessment Appearance:: Appears stated age Attitude/Demeanor/Rapport: Engaged, Gracious Affect (typically observed): Appropriate Orientation: : Oriented to Self, Oriented to Place, Oriented to  Time Alcohol / Substance Use: Not Applicable    Admission diagnosis:  Dehydration [E86.0] Acute cystitis without hematuria [N30.00] AKI (acute kidney injury) [N17.9] Patient Active Problem List   Diagnosis Date Noted   AKI (acute kidney injury) 04/08/2024   Urinary tract infection 04/08/2024   Acute gastroenteritis 04/08/2024   Chronic heart failure with preserved ejection fraction (HFpEF) (HCC) 04/08/2024   Generalized weakness 04/08/2024   Elevated lipase 04/08/2024   High anion gap metabolic acidosis 04/08/2024   Possible A-fib on EKG, with PVCs 04/08/2024   Trigger middle finger of right hand 03/20/2016   Diverticulitis of colon    Stenosis of intestine (HCC)    Diverticulitis 03/01/2015   Diverticulitis of large intestine without perforation or abscess without bleeding    B12 deficiency 11/07/2013   Adult hypothyroidism 11/05/2013   HLD (hyperlipidemia) 10/14/2012   Fatty infiltration of liver 04/18/2012   Essential hypertension 02/11/2012   Hematuria 01/22/2012   PCP:  Pinal Oneil FALCON, MD Pharmacy:   [facility] [address] [place] - [address] [phone]  [place] [address] Phone: [phone] Fax: [phone]     Social Drivers of Health (SDOH) Social History: SDOH Screenings   Food Insecurity: No Food Insecurity (04/09/2024)  Housing: Low Risk  (04/09/2024)  Transportation Needs: No Transportation Needs (04/09/2024)  Utilities: Not At Risk (04/09/2024)  Financial Resource Strain: Low Risk  (03/07/2024)   Received from [facility]  Social Connections: Socially  Integrated (04/09/2024)  Tobacco Use: Low Risk  (04/09/2024)   SDOH Interventions:     Readmission Risk Interventions     No data to display

## 2024-04-12 NOTE — [place] FL2 (Signed)
 [place]  MEDICAID FL2 LEVEL OF CARE FORM     IDENTIFICATION  Patient Name: Tracy Haas Birthdate: June 04, 1945 Sex: female Admission Date (Current Location): 04/08/2024  [facility] and [place] Number:  Chiropodist and Address:  [facility], [address] [place], [place] [address]      Provider Number: [id number]  Attending Physician Name and Address:  Leesa Kast, DO  Relative Name and Phone Number:       Current Level of Care: SNF Recommended Level of Care: Skilled Nursing Facility Prior Approval Number:    Date Approved/Denied:   PASRR Number: [id number]  Discharge Plan: SNF    Current Diagnoses: Patient Active Problem List   Diagnosis Date Noted   AKI (acute kidney injury) 04/08/2024   Urinary tract infection 04/08/2024   Acute gastroenteritis 04/08/2024   Chronic heart failure with preserved ejection fraction (HFpEF) (HCC) 04/08/2024   Generalized weakness 04/08/2024   Elevated lipase 04/08/2024   High anion gap metabolic acidosis 04/08/2024   Possible A-fib on EKG, with PVCs 04/08/2024   Trigger middle finger of right hand 03/20/2016   Diverticulitis of colon    Stenosis of intestine (HCC)    Diverticulitis 03/01/2015   Diverticulitis of large intestine without perforation or abscess without bleeding    B12 deficiency 11/07/2013   Adult hypothyroidism 11/05/2013   HLD (hyperlipidemia) 10/14/2012   Fatty infiltration of liver 04/18/2012   Essential hypertension 02/11/2012   Hematuria 01/22/2012    Orientation RESPIRATION BLADDER Height & Weight     Self, Time  Normal Continent Weight: 49.6 kg Height:  4' 11 (149.9 cm)  BEHAVIORAL SYMPTOMS/MOOD NEUROLOGICAL BOWEL NUTRITION STATUS   (WDL)  (none) Continent Diet (heart diet)  AMBULATORY STATUS COMMUNICATION OF NEEDS Skin   Limited Assist Verbally Normal                       Personal Care Assistance Level of Assistance  Bathing, Feeding, Dressing  Bathing Assistance: Limited assistance Feeding assistance: Limited assistance Dressing Assistance: Limited assistance     Functional Limitations Info  Sight, Hearing, Speech Sight Info: Impaired Hearing Info: Adequate Speech Info: Adequate    SPECIAL CARE FACTORS FREQUENCY  PT (By licensed PT), OT (By licensed OT)     PT Frequency: 5 x week OT Frequency: 5 x week            Contractures Contractures Info: Not present    Additional Factors Info  Code Status, Allergies Code Status Info: full code Allergies Info: NKDA           Current Medications (04/12/2024):  This is the current hospital active medication list Current Facility-Administered Medications  Medication Dose Route Frequency Provider Last Rate Last Admin   acetaminophen  (TYLENOL ) tablet 650 mg  650 mg Oral Q6H PRN Duncan, Hazel V, MD   650 mg at 04/09/24 2130   Or   acetaminophen  (TYLENOL ) suppository 650 mg  650 mg Rectal Q6H PRN Duncan, Hazel V, MD       alum & mag hydroxide-simeth (MAALOX/MYLANTA) 200-200-20 MG/5ML suspension 30 mL  30 mL Oral Q4H PRN Agbata, Tochukwu, MD       amiodarone  (PACERONE ) tablet 400 mg  400 mg Oral BID Hudson, Caralyn, PA-C   400 mg at 04/12/24 9074   Followed by   NOREEN ON 04/21/2024] amiodarone  (PACERONE ) tablet 200 mg  200 mg Oral Daily Hudson, Caralyn, PA-C       apixaban  (ELIQUIS ) tablet 5 mg  5 mg Oral BID Agbata, Tochukwu, MD   5 mg at 04/12/24 0925   bisoprolol  (ZEBETA ) tablet 5 mg  5 mg Oral Daily Agbata, Tochukwu, MD   5 mg at 04/12/24 0925   cephALEXin  (KEFLEX ) capsule 500 mg  500 mg Oral Q8H Agbata, Tochukwu, MD   500 mg at 04/12/24 9376   HYDROcodone -acetaminophen  (NORCO/VICODIN) 5-325 MG per tablet 1-2 tablet  1-2 tablet Oral Q4H PRN Duncan, Hazel V, MD       metoprolol  tartrate (LOPRESSOR ) injection 5 mg  5 mg Intravenous Q6H PRN Duncan, Hazel V, MD   5 mg at 04/09/24 2255   ondansetron  (ZOFRAN ) tablet 4 mg  4 mg Oral Q6H PRN Duncan, Hazel V, MD       Or    ondansetron  (ZOFRAN ) injection 4 mg  4 mg Intravenous Q6H PRN Duncan, Hazel V, MD   4 mg at 04/10/24 0425     Discharge Medications: Please see discharge summary for a list of discharge medications.  Relevant Imaging Results:  Relevant Lab Results:   Additional Information    Shasta DELENA Daring, RN

## 2024-04-12 NOTE — Evaluation (Signed)
 Occupational Therapy Evaluation Patient Details Name: Tracy Haas MRN: [id number] DOB: Aug 13, 1945 Today's Date: 04/12/2024   History of Present Illness   Tracy Haas is a 78 y.o. female with medical history significant for Hypertension, HFpEF with moderate AI and MR, being admitted with urinary tract infection and acute kidney injury in the setting of acute enteritis.  Patient has been having  diarrhea for the past week with worsening weakness.  On the day of arrival, she was found on the floor by her son awake and alert.  States she was so weak she lowered herself to the floor after using the bathroom.  Denies falling or hitting her head.     Clinical Impressions Pt was seen for OT evaluation this date. Prior to hospital admission, pt was independent and living alone. Pt presents with deficits in balance, activity tolerance, and HOH, affecting safe and optimal ADL completion. Pt currently requires increased processing time for commands, CGA for ADL transfers and in-room mobility with RW, VC for RW use, and MIN A for LB ADL and pericare after toileting. Pt would benefit from skilled OT services to address noted impairments and functional limitations (see below for any additional details) in order to maximize safety and independence while minimizing future risk of falls, injury, and readmission. Anticipate the need for follow up OT services upon acute hospital DC.    If plan is discharge home, recommend the following:   A little help with walking and/or transfers;A little help with bathing/dressing/bathroom;Assistance with cooking/housework;Assist for transportation;Help with stairs or ramp for entrance     Functional Status Assessment   Patient has had a recent decline in their functional status and demonstrates the ability to make significant improvements in function in a reasonable and predictable amount of time.     Equipment Recommendations   Other (comment) (defer)      Recommendations for Other Services         Precautions/Restrictions   Precautions Precautions: Fall Recall of Precautions/Restrictions: Intact Restrictions Weight Bearing Restrictions Per Provider Order: No     Mobility Bed Mobility               General bed mobility comments: NT, transferring to [facility] with RN    Transfers Overall transfer level: Needs assistance Equipment used: Rolling walker (2 wheels) Transfers: Sit to/from Stand Sit to Stand: Contact guard assist           General transfer comment: CGA from EOB and from [facility]      Balance Overall balance assessment: Needs assistance Sitting-balance support: Feet supported, No upper extremity supported Sitting balance-Leahy Scale: Fair     Standing balance support: During functional activity, Reliant on assistive device for balance, No upper extremity supported Standing balance-Leahy Scale: Fair Standing balance comment: fair static while at sink for grooming                           ADL either performed or assessed with clinical judgement   ADL Overall ADL's : Needs assistance/impaired     Grooming: Standing;Wash/dry face;Wash/dry hands;Oral care;Contact guard assist;Supervision/safety                   Toilet Transfer: Contact guard assist;BSC/3in1;Rolling walker (2 wheels)   Toileting- Clothing Manipulation and Hygiene: Set up;Minimal assistance;Sit to/from stand Toileting - Clothing Manipulation Details (indicate cue type and reason): MIN A for thoroughness     Functional mobility during ADLs: Contact guard assist;Rolling walker (  2 wheels)        Pertinent Vitals/Pain Pain Assessment Pain Assessment: No/denies pain     Extremity/Trunk Assessment Upper Extremity Assessment Upper Extremity Assessment: Overall WFL for tasks assessed   Lower Extremity Assessment Lower Extremity Assessment: Overall WFL for tasks assessed   Cervical / Trunk Assessment Cervical /  Trunk Assessment: Kyphotic   Communication Communication Communication: Impaired Factors Affecting Communication: Hearing impaired   Cognition Arousal: Alert Behavior During Therapy: WFL for tasks assessed/performed Cognition: No family/caregiver present to determine baseline             OT - Cognition Comments: a bit slow to process but also HOH                 Following commands: Impaired Following commands impaired: Follows one step commands with increased time     Cueing  General Comments   Cueing Techniques: Verbal cues      Exercises     Shoulder Instructions      Home Living Family/patient expects to be discharged to:: Private residence Living Arrangements: Alone Available Help at Discharge: Family Type of Home: House Home Access: Stairs to enter Secretary/administrator of Steps: 3 Entrance Stairs-Rails: None Home Layout: Able to live on main level with bedroom/bathroom     Bathroom Shower/Tub: Producer, Television/film/video: Standard Bathroom Accessibility: Yes   Home Equipment: None          Prior Functioning/Environment Prior Level of Function : Independent/Modified Independent             Mobility Comments: IND with mobility - no AD ADLs Comments: IND with ADLs    OT Problem List: Decreased activity tolerance;Impaired balance (sitting and/or standing);Decreased knowledge of use of DME or AE   OT Treatment/Interventions: Self-care/ADL training;Therapeutic exercise;Therapeutic activities;DME and/or AE instruction;Energy conservation;Patient/family education;Balance training      OT Goals(Current goals can be found in the care plan section)   Acute Rehab OT Goals Patient Stated Goal: get better OT Goal Formulation: With patient Time For Goal Achievement: 04/26/24 Potential to Achieve Goals: Good ADL Goals Pt Will Perform Lower Body Dressing: with modified independence;sitting/lateral leans;sit to/from stand Pt Will  Transfer to Toilet: with modified independence;ambulating (LRAD) Pt Will Perform Toileting - Clothing Manipulation and hygiene: with modified independence;sitting/lateral leans;sit to/from stand Additional ADL Goal #1: Pt will verbalize plan to implement at least 2 learned falls prevention/EC strategies to maximize safety.   OT Frequency:  Min 2X/week    Co-evaluation              AM-PAC OT 6 Clicks Daily Activity     Outcome Measure Help from another person eating meals?: None Help from another person taking care of personal grooming?: A Little Help from another person toileting, which includes using toliet, bedpan, or urinal?: A Little Help from another person bathing (including washing, rinsing, drying)?: A Little Help from another person to put on and taking off regular upper body clothing?: None Help from another person to put on and taking off regular lower body clothing?: A Little 6 Click Score: 20   End of Session Equipment Utilized During Treatment: Rolling walker (2 wheels) Nurse Communication: Mobility status  Activity Tolerance: Patient tolerated treatment well Patient left: in chair;with call bell/phone within reach;with chair alarm set  OT Visit Diagnosis: Unsteadiness on feet (R26.81);History of falling (Z91.81)                Time: 9075-9057 OT Time Calculation (min): 18 min  Charges:  OT General Charges $OT Visit: 1 Visit OT Evaluation $OT Eval Low Complexity: 1 Low OT Treatments $Self Care/Home Management : 8-22 mins  Warren SAUNDERS., MPH, MS, OTR/L ascom [phone] 04/12/24, 2:02 PM

## 2024-04-12 NOTE — Progress Notes (Signed)
 PROGRESS NOTE    Tracy Haas  FMW:[id number] DOB: Mar 15, 1946 DOA: 04/08/2024 PCP: Cleotilde Oneil FALCON, MD  Chief Complaint  Patient presents with   Tracy Haas Course:  Tracy Haas is a 78 year old female with hypertension, heart failure with preserved EF, who was admitted with UTI and AKI in setting of acute gastroenteritis.  Patient's been having diarrhea for a week prior to arrival with worsening weakness.  On arrival patient a creatinine of 2.89 up from her baseline of 0.9 with metabolic acidosis, bicarb 10, anion gap 19.  EKG revealed A-fib.  CT abdomen pelvis was nonacute.  UA consistent with UTI.  Patient received ceftriaxone  and LR.  During the admission she went into rapid A-fib on 11/23 with heart rates between 130 and 180.  She was started on an amiodarone  drip and converted to sinus rhythm.  She was started on long-term anticoagulation and gradually transition to p.o. amiodarone  with cardiology.  Her AKI gradually resolved.  Diarrhea also resolved.  Due to her hospital course and deconditioning she is weak beyond her baseline.  Physical therapy is recommending skilled nursing.  We are now pending placement.  Subjective: No acute events overnight.  Patient has no acute complaints this morning.   Objective: Vitals:   04/12/24 0800 04/12/24 1150 04/12/24 1200 04/12/24 1600  BP: (!) 120/57 (!) 119/59 (!) 99/49 (!) 108/57  Pulse: 73 65  61  Resp: 20  18 15   Temp: 98.7 F (37.1 C) 98.7 F (37.1 C)  98.6 F (37 C)  TempSrc: Oral Oral  Oral  SpO2: 99% 100%  100%  Weight:      Height:        Intake/Output Summary (Last 24 hours) at 04/12/2024 1707 Last data filed at 04/12/2024 1402 Gross per 24 hour  Intake 480 ml  Output --  Net 480 ml   Filed Weights   04/08/24 1754  Weight: 49.6 kg    Examination: General exam: Appears calm and comfortable, NAD  Respiratory system: No work of breathing, symmetric chest wall expansion Cardiovascular system: S1 & S2 heard,  RRR.  Gastrointestinal system: Abdomen is nondistended, soft and nontender.  Neuro: Alert and oriented. No focal neurological deficits. Extremities: Symmetric, expected ROM Skin: No rashes, lesions Psychiatry: Demonstrates appropriate judgement and insight. Mood & affect appropriate for situation.   Assessment & Plan:  Principal Problem:   AKI (acute kidney injury) Active Problems:   High anion gap metabolic acidosis   Urinary tract infection   Acute gastroenteritis   Elevated lipase   Possible A-fib on EKG, with PVCs   Generalized weakness   Chronic heart failure with preserved ejection fraction (HFpEF) (HCC)   Essential hypertension    Paroxysmal A-fib with RVR - New onset - Developed rapid A-fib overnight 11/23 up to 180 - Initially on amiodarone  drip, now NSR - CHA2DS2-VASc: 4 - Continue with Eliquis  twice daily - Now transitioning to oral amiodarone  - Cardiology has been following - Echo: LVEF 60 to 65%, grade 1 diastolic dysfunction.  Moderate aortic valve with tricuspid valve regurg  AKI High anion gap metabolic acidosis - Secondary to GI losses from diarrhea, likely prerenal and some degree of ATN given she was also on diuretic and ACE inhibitor - Creatinine 2.89 on arrival up from baseline of 0.9.  Bicarb 10, anion gap 19.  BUN elevated at 103. - Status post bicarb and IV fluids - Encourage oral intake now - Creatinine resolved now  Demand ischemia - Troponin  elevation initially concerning for myocardial infarction - Now thought to be secondary to tachycardia.  No concern of ACS at this time per cardiology - LVEF preserved with no evidence of wall motion abnormalities.  UTI - Status post 4 days of IV Rocephin , completing course with Keflex . - Urine culture with  E. coli sensitive to cephalosporins  Acute gastroenteritis, now resolved Elevated lipase, not consistent with pancreatitis - CT abdomen pelvis without acute findings - Lipase likely secondary to  gastroenteritis,  Generalized weakness - Secondary to all of the above.  Weaker than baseline - PT/OT recommending SNF - TOC consulted for SNF placement  Chronic heart failure with preserved EF - Euvolemic now - Gradually resume home meds as needed  Hypertension - Resume bisoprolol   Hypokalemia Hypomagnesemia - Secondary to GI losses - Supplement as needed.  Follow CMP  Body mass index is 22.08 kg/m. - Outpatient follow up for lifestyle modification and risk factor management  Anemia - Hemoglobin on arrival 12.8, has been slowly downtrending.  Suspect this is hemodilution and I suspect the initial sample was a dehydrated sample - Continue to monitor closely - No overt signs of bleeding - Hemoglobin appears stable. - Will obtain iron studies  DVT prophylaxis: eliqius   Code Status: Full Code Disposition:  Inpatient pending SNF  Consultants:  Treatment Team:  Consulting Physician: Dewane Shiner, DO  Procedures:    Antimicrobials:  Anti-infectives (From admission, onward)    Start     Dose/Rate Route Frequency Ordered Stop   04/12/24 0600  cephALEXin  (KEFLEX ) capsule 500 mg        500 mg Oral Every 8 hours 04/11/24 1004 04/13/24 1359   04/08/24 2230  cefTRIAXone  (ROCEPHIN ) 1 g in sodium chloride  0.9 % 100 mL IVPB        1 g 200 mL/hr over 30 Minutes Intravenous Every 24 hours 04/08/24 2224 04/11/24 1239   04/08/24 2130  cefTRIAXone  (ROCEPHIN ) 1 g in sodium chloride  0.9 % 100 mL IVPB  Status:  Discontinued        1 g 200 mL/hr over 30 Minutes Intravenous  Once 04/08/24 2127 04/08/24 2228       Data Reviewed: I have personally reviewed following labs and imaging studies CBC: Recent Labs  Lab 04/08/24 1803 04/09/24 1103 04/10/24 0331 04/11/24 0400 04/12/24 0410  WBC 9.4 7.9 6.0 4.8 6.4  NEUTROABS 7.9*  --   --   --   --   HGB 12.8 10.8* 10.1* 9.1* 9.3*  HCT 36.1 29.8* 28.5* 27.3* 27.6*  MCV 88.3 86.6 88.2 94.1 91.7  PLT 197 159 138* 134* 128*    Basic Metabolic Panel: Recent Labs  Lab 04/08/24 1803 04/09/24 1103 04/10/24 0010 04/10/24 0331 04/11/24 0400 04/11/24 0841 04/12/24 0410  NA 135 137 138 139 139  --  136  K 3.9 3.4* 3.6 3.8 3.4*  --  4.5  CL 107 106 104 108 107  --  106  CO2 10* 18* 23 21* 22  --  22  GLUCOSE 157* 114* 120* 112* 108*  --  103*  BUN 103* 88* 70* 63* 35*  --  21  CREATININE 2.89* 1.91* 1.64* 1.46* 1.12*  --  0.83  CALCIUM 9.3 8.4* 8.0* 7.9* 7.7*  --  7.9*  MG 1.8  --  2.5* 2.4  --  1.6*  --    GFR: Estimated Creatinine Clearance: 38.1 mL/min (by C-G formula based on SCr of 0.83 mg/dL). Liver Function Tests: Recent Labs  Lab 04/08/24  1803  AST <10*  ALT 6  ALKPHOS 80  BILITOT 0.7  PROT 6.6  ALBUMIN 3.7   CBG: No results for input(s): GLUCAP in the last 168 hours.  Recent Results (from the past 240 hours)  Urine Culture     Status: Abnormal   Collection Time: 04/08/24  8:20 PM   Specimen: Urine, Clean Catch  Result Value Ref Range Status   Specimen Description   Final    URINE, CLEAN CATCH Performed at [facility], [address], [place], [place] [address]    Special Requests   Final    NONE Performed at [facility], [address] [place], [place] [address]    Culture >=100,000 COLONIES/mL ESCHERICHIA COLI (A)  Final   Report Status 04/12/2024 FINAL  Final   Organism ID, Bacteria ESCHERICHIA COLI (A)  Final      Susceptibility   Escherichia coli - MIC*    AMPICILLIN >=32 RESISTANT Resistant     CEFAZOLIN  (URINE) Value in next row Sensitive      8 SENSITIVEThis is a modified FDA-approved test that has been validated and its performance characteristics determined by the reporting laboratory.  This laboratory is certified under the Clinical Laboratory Improvement Amendments CLIA as qualified to perform high complexity clinical laboratory testing.    CEFEPIME Value in next row Sensitive      8 SENSITIVEThis is a modified FDA-approved test that has been  validated and its performance characteristics determined by the reporting laboratory.  This laboratory is certified under the Clinical Laboratory Improvement Amendments CLIA as qualified to perform high complexity clinical laboratory testing.    ERTAPENEM Value in next row Sensitive      8 SENSITIVEThis is a modified FDA-approved test that has been validated and its performance characteristics determined by the reporting laboratory.  This laboratory is certified under the Clinical Laboratory Improvement Amendments CLIA as qualified to perform high complexity clinical laboratory testing.    CEFTRIAXONE  Value in next row Sensitive      8 SENSITIVEThis is a modified FDA-approved test that has been validated and its performance characteristics determined by the reporting laboratory.  This laboratory is certified under the Clinical Laboratory Improvement Amendments CLIA as qualified to perform high complexity clinical laboratory testing.    CIPROFLOXACIN  Value in next row Resistant      8 SENSITIVEThis is a modified FDA-approved test that has been validated and its performance characteristics determined by the reporting laboratory.  This laboratory is certified under the Clinical Laboratory Improvement Amendments CLIA as qualified to perform high complexity clinical laboratory testing.    GENTAMICIN Value in next row Resistant      8 SENSITIVEThis is a modified FDA-approved test that has been validated and its performance characteristics determined by the reporting laboratory.  This laboratory is certified under the Clinical Laboratory Improvement Amendments CLIA as qualified to perform high complexity clinical laboratory testing.    NITROFURANTOIN Value in next row Sensitive      8 SENSITIVEThis is a modified FDA-approved test that has been validated and its performance characteristics determined by the reporting laboratory.  This laboratory is certified under the Clinical Laboratory Improvement Amendments  CLIA as qualified to perform high complexity clinical laboratory testing.    TRIMETH/SULFA Value in next row Sensitive      8 SENSITIVEThis is a modified FDA-approved test that has been validated and its performance characteristics determined by the reporting laboratory.  This laboratory is certified under the Clinical Laboratory Improvement Amendments  CLIA as qualified to perform high complexity clinical laboratory testing.    AMPICILLIN/SULBACTAM Value in next row Intermediate      8 SENSITIVEThis is a modified FDA-approved test that has been validated and its performance characteristics determined by the reporting laboratory.  This laboratory is certified under the Clinical Laboratory Improvement Amendments CLIA as qualified to perform high complexity clinical laboratory testing.    PIP/TAZO Value in next row Sensitive      <=4 SENSITIVEThis is a modified FDA-approved test that has been validated and its performance characteristics determined by the reporting laboratory.  This laboratory is certified under the Clinical Laboratory Improvement Amendments CLIA as qualified to perform high complexity clinical laboratory testing.    MEROPENEM Value in next row Sensitive      <=4 SENSITIVEThis is a modified FDA-approved test that has been validated and its performance characteristics determined by the reporting laboratory.  This laboratory is certified under the Clinical Laboratory Improvement Amendments CLIA as qualified to perform high complexity clinical laboratory testing.    * >=100,000 COLONIES/mL ESCHERICHIA COLI  Gastrointestinal Panel by PCR , Stool     Status: None   Collection Time: 04/10/24  3:45 PM   Specimen: Stool  Result Value Ref Range Status   Campylobacter species NOT DETECTED NOT DETECTED Final   Plesimonas shigelloides NOT DETECTED NOT DETECTED Final   Salmonella species NOT DETECTED NOT DETECTED Final   Yersinia enterocolitica NOT DETECTED NOT DETECTED Final   Vibrio species NOT  DETECTED NOT DETECTED Final   Vibrio cholerae NOT DETECTED NOT DETECTED Final   Enteroaggregative E coli (EAEC) NOT DETECTED NOT DETECTED Final   Enteropathogenic E coli (EPEC) NOT DETECTED NOT DETECTED Final   Enterotoxigenic E coli (ETEC) NOT DETECTED NOT DETECTED Final   Shiga like toxin producing E coli (STEC) NOT DETECTED NOT DETECTED Final   Shigella/Enteroinvasive E coli (EIEC) NOT DETECTED NOT DETECTED Final   Cryptosporidium NOT DETECTED NOT DETECTED Final   Cyclospora cayetanensis NOT DETECTED NOT DETECTED Final   Entamoeba histolytica NOT DETECTED NOT DETECTED Final   Giardia lamblia NOT DETECTED NOT DETECTED Final   Adenovirus F40/41 NOT DETECTED NOT DETECTED Final   Astrovirus NOT DETECTED NOT DETECTED Final   Norovirus GI/GII NOT DETECTED NOT DETECTED Final   Rotavirus A NOT DETECTED NOT DETECTED Final   Sapovirus (I, II, IV, and V) NOT DETECTED NOT DETECTED Final    Comment: Performed at [facility], [address] [place], [place] [address]     Radiology Studies: No results found.  Scheduled Meds:  amiodarone   400 mg Oral BID   Followed by   NOREEN ON 04/21/2024] amiodarone   200 mg Oral Daily   apixaban   5 mg Oral BID   bisoprolol   5 mg Oral Daily   cephALEXin   500 mg Oral Q8H   Continuous Infusions:   LOS: 3 days  MDM: Patient is high risk for one or more organ failure.  They necessitate ongoing hospitalization for continued IV therapies and subsequent lab monitoring. Total time spent interpreting labs and vitals, reviewing the medical record, coordinating care amongst consultants and care team members, directly assessing and discussing care with the patient and/or family: 55 min  Marni Franzoni, DO [facility]  To contact the attending physician between 7A-7P please use Epic Chat. To contact the covering physician during after hours 7P-7A, please review Amion.  04/12/2024, 5:07 PM   *This document has been created with the assistance of  dictation software. Please excuse typographical  errors. *

## 2024-04-13 DIAGNOSIS — N179 Acute kidney failure, unspecified: Secondary | ICD-10-CM | POA: Diagnosis not present

## 2024-04-13 LAB — IRON AND TIBC
Iron: 16 ug/dL — ABNORMAL LOW (ref 28–170)
Saturation Ratios: 7 % — ABNORMAL LOW (ref 10.4–31.8)
TIBC: 220 ug/dL — ABNORMAL LOW (ref 250–450)
UIBC: 204 ug/dL

## 2024-04-13 LAB — FERRITIN: Ferritin: 426 ng/mL — ABNORMAL HIGH (ref 11–307)

## 2024-04-13 LAB — FOLATE: Folate: 10.7 ng/mL (ref >5.9)

## 2024-04-13 LAB — VITAMIN B12: Vitamin B-12: 409 pg/mL (ref 180–914)

## 2024-04-13 LAB — RETICULOCYTES
Immature Retic Fract: 13.4 % (ref 2.3–15.9)
RBC.: 3.31 MIL/uL — ABNORMAL LOW (ref 3.87–5.11)
Retic Count, Absolute: 80.8 K/uL (ref 19.0–186.0)
Retic Ct Pct: 2.4 % (ref 0.4–3.1)

## 2024-04-13 NOTE — Progress Notes (Signed)
 PROGRESS NOTE    Tracy Haas  FMW:[id number] DOB: 10-Jul-1945 DOA: 04/08/2024 PCP: Cleotilde Oneil FALCON, MD  Chief Complaint  Patient presents with   Tracy Haas Course:  Tracy Haas is a 78 year old female with hypertension, heart failure with preserved EF, who was admitted with UTI and AKI in setting of acute gastroenteritis.  Patient's been having diarrhea for a week prior to arrival with worsening weakness.  On arrival patient a creatinine of 2.89 up from her baseline of 0.9 with metabolic acidosis, bicarb 10, anion gap 19.  EKG revealed A-fib.  CT abdomen pelvis was nonacute.  UA consistent with UTI.  Patient received ceftriaxone  and LR.  During the admission she went into rapid A-fib on 11/23 with heart rates between 130 and 180.  She was started on an amiodarone  drip and converted to sinus rhythm.  She was started on long-term anticoagulation and gradually transition to p.o. amiodarone  with cardiology.  Her AKI gradually resolved.  Diarrhea also resolved.  Due to her hospital course and deconditioning she is weak beyond her baseline.  Physical therapy is recommending skilled nursing.  We are now pending placement.  Subjective: No acute events overnight.  Patient is still pending placement.  TOC working on it. She has no acute complaints this morning.  Objective: Vitals:   04/13/24 0515 04/13/24 0800 04/13/24 0900 04/13/24 1230  BP: (!) 115/57 134/62  (!) 147/71  Pulse: (!) 59 65  70  Resp: 16 (!) 21 17 18   Temp: 97.9 F (36.6 C) 99.2 F (37.3 C)  99.3 F (37.4 C)  TempSrc: Oral Oral  Oral  SpO2:  97%  97%  Weight:      Height:        Intake/Output Summary (Last 24 hours) at 04/13/2024 1351 Last data filed at 04/13/2024 0900 Gross per 24 hour  Intake 600 ml  Output --  Net 600 ml   Filed Weights   04/08/24 1754  Weight: 49.6 kg    Examination: General exam: Appears calm and comfortable, NAD  Respiratory system: No work of breathing, symmetric chest wall  expansion Cardiovascular system: S1 & S2 heard, RRR.  Gastrointestinal system: Abdomen is nondistended, soft and nontender.  Neuro: Alert and oriented. No focal neurological deficits. Extremities: Symmetric, expected ROM Skin: No rashes, lesions Psychiatry: Demonstrates appropriate judgement and insight. Mood & affect appropriate for situation.   Assessment & Plan:  Principal Problem:   AKI (acute kidney injury) Active Problems:   High anion gap metabolic acidosis   Urinary tract infection   Acute gastroenteritis   Elevated lipase   Possible A-fib on EKG, with PVCs   Generalized weakness   Chronic heart failure with preserved ejection fraction (HFpEF) (HCC)   Essential hypertension    Paroxysmal A-fib with RVR - New onset - Developed rapid A-fib overnight 11/23 up to 180 - Initially on amiodarone  drip, now NSR - CHA2DS2-VASc: 4 - Continue with Eliquis  twice daily - Now transitioning to oral amiodarone  - Cardiology has been following - Echo: LVEF 60 to 65%, grade 1 diastolic dysfunction.  Moderate aortic valve with tricuspid valve regurg  AKI High anion gap metabolic acidosis - Secondary to GI losses from diarrhea, likely prerenal and some degree of ATN given she was also on diuretic and ACE inhibitor - Creatinine 2.89 on arrival up from baseline of 0.9.  Bicarb 10, anion gap 19.  BUN elevated at 103. - Status post bicarb and IV fluids - Encourage oral intake now -  Creatinine resolved now  Demand ischemia - Troponin elevation initially concerning for myocardial infarction - Now thought to be secondary to tachycardia.  No concern of ACS at this time per cardiology - LVEF preserved with no evidence of wall motion abnormalities.  UTI - Status post 4 days of IV Rocephin , completed course with Keflex . - Urine culture with  E. coli sensitive to cephalosporins  Acute gastroenteritis, now resolved Elevated lipase, not consistent with pancreatitis - CT abdomen pelvis without  acute findings - Lipase likely secondary to gastroenteritis,  Generalized weakness - Secondary to all of the above.  Weaker than baseline - PT/OT recommending SNF - TOC consulted for SNF placement  Chronic heart failure with preserved EF - Gradually resume home meds as needed - As needed diuretics.  Not needed today.  Clinically euvolemic  Hypertension - Resume bisoprolol   Hypokalemia Hypomagnesemia - Secondary to GI losses - Supplement as needed.  Follow CMP  Body mass index is 22.08 kg/m. - Outpatient follow up for lifestyle modification and risk factor management  Anemia - Hemoglobin on arrival 12.8, has been slowly downtrending.  Suspect this is hemodilution and I suspect the initial sample was a dehydrated sample - Continue to monitor closely - No overt signs of bleeding - Hemoglobin appears stable. -Iron studies: Low iron, low TIBC, low TSAT ratio, high ferritin.  Consistent with anemia of chronic disease.  DVT prophylaxis: eliqius   Code Status: Full Code Disposition:  Inpatient pending SNF  Consultants:  Treatment Team:  Consulting Physician: Dewane Shiner, DO  Procedures:    Antimicrobials:  Anti-infectives (From admission, onward)    Start     Dose/Rate Route Frequency Ordered Stop   04/12/24 0600  cephALEXin  (KEFLEX ) capsule 500 mg        500 mg Oral Every 8 hours 04/11/24 1004 04/13/24 0557   04/08/24 2230  cefTRIAXone  (ROCEPHIN ) 1 g in sodium chloride  0.9 % 100 mL IVPB        1 g 200 mL/hr over 30 Minutes Intravenous Every 24 hours 04/08/24 2224 04/11/24 1239   04/08/24 2130  cefTRIAXone  (ROCEPHIN ) 1 g in sodium chloride  0.9 % 100 mL IVPB  Status:  Discontinued        1 g 200 mL/hr over 30 Minutes Intravenous  Once 04/08/24 2127 04/08/24 2228       Data Reviewed: I have personally reviewed following labs and imaging studies CBC: Recent Labs  Lab 04/08/24 1803 04/09/24 1103 04/10/24 0331 04/11/24 0400 04/12/24 0410  WBC 9.4 7.9 6.0 4.8  6.4  NEUTROABS 7.9*  --   --   --   --   HGB 12.8 10.8* 10.1* 9.1* 9.3*  HCT 36.1 29.8* 28.5* 27.3* 27.6*  MCV 88.3 86.6 88.2 94.1 91.7  PLT 197 159 138* 134* 128*   Basic Metabolic Panel: Recent Labs  Lab 04/08/24 1803 04/09/24 1103 04/10/24 0010 04/10/24 0331 04/11/24 0400 04/11/24 0841 04/12/24 0410  NA 135 137 138 139 139  --  136  K 3.9 3.4* 3.6 3.8 3.4*  --  4.5  CL 107 106 104 108 107  --  106  CO2 10* 18* 23 21* 22  --  22  GLUCOSE 157* 114* 120* 112* 108*  --  103*  BUN 103* 88* 70* 63* 35*  --  21  CREATININE 2.89* 1.91* 1.64* 1.46* 1.12*  --  0.83  CALCIUM 9.3 8.4* 8.0* 7.9* 7.7*  --  7.9*  MG 1.8  --  2.5* 2.4  --  1.6*  --    GFR: Estimated Creatinine Clearance: 38.1 mL/min (by C-G formula based on SCr of 0.83 mg/dL). Liver Function Tests: Recent Labs  Lab 04/08/24 1803  AST <10*  ALT 6  ALKPHOS 80  BILITOT 0.7  PROT 6.6  ALBUMIN 3.7   CBG: No results for input(s): GLUCAP in the last 168 hours.  Recent Results (from the past 240 hours)  Urine Culture     Status: Abnormal   Collection Time: 04/08/24  8:20 PM   Specimen: Urine, Clean Catch  Result Value Ref Range Status   Specimen Description   Final    URINE, CLEAN CATCH Performed at [facility], [address] [place], [place] [address]    Special Requests   Final    NONE Performed at [facility], [address] [place], [place] [address]    Culture >=100,000 COLONIES/mL ESCHERICHIA COLI (A)  Final   Report Status 04/12/2024 FINAL  Final   Organism ID, Bacteria ESCHERICHIA COLI (A)  Final      Susceptibility   Escherichia coli - MIC*    AMPICILLIN >=32 RESISTANT Resistant     CEFAZOLIN  (URINE) Value in next row Sensitive      8 SENSITIVEThis is a modified FDA-approved test that has been validated and its performance characteristics determined by the reporting laboratory.  This laboratory is certified under the Clinical Laboratory Improvement Amendments CLIA as  qualified to perform high complexity clinical laboratory testing.    CEFEPIME Value in next row Sensitive      8 SENSITIVEThis is a modified FDA-approved test that has been validated and its performance characteristics determined by the reporting laboratory.  This laboratory is certified under the Clinical Laboratory Improvement Amendments CLIA as qualified to perform high complexity clinical laboratory testing.    ERTAPENEM Value in next row Sensitive      8 SENSITIVEThis is a modified FDA-approved test that has been validated and its performance characteristics determined by the reporting laboratory.  This laboratory is certified under the Clinical Laboratory Improvement Amendments CLIA as qualified to perform high complexity clinical laboratory testing.    CEFTRIAXONE  Value in next row Sensitive      8 SENSITIVEThis is a modified FDA-approved test that has been validated and its performance characteristics determined by the reporting laboratory.  This laboratory is certified under the Clinical Laboratory Improvement Amendments CLIA as qualified to perform high complexity clinical laboratory testing.    CIPROFLOXACIN  Value in next row Resistant      8 SENSITIVEThis is a modified FDA-approved test that has been validated and its performance characteristics determined by the reporting laboratory.  This laboratory is certified under the Clinical Laboratory Improvement Amendments CLIA as qualified to perform high complexity clinical laboratory testing.    GENTAMICIN Value in next row Resistant      8 SENSITIVEThis is a modified FDA-approved test that has been validated and its performance characteristics determined by the reporting laboratory.  This laboratory is certified under the Clinical Laboratory Improvement Amendments CLIA as qualified to perform high complexity clinical laboratory testing.    NITROFURANTOIN Value in next row Sensitive      8 SENSITIVEThis is a modified FDA-approved test that has  been validated and its performance characteristics determined by the reporting laboratory.  This laboratory is certified under the Clinical Laboratory Improvement Amendments CLIA as qualified to perform high complexity clinical laboratory testing.    TRIMETH/SULFA Value in next row Sensitive      8 SENSITIVEThis  is a modified FDA-approved test that has been validated and its performance characteristics determined by the reporting laboratory.  This laboratory is certified under the Clinical Laboratory Improvement Amendments CLIA as qualified to perform high complexity clinical laboratory testing.    AMPICILLIN/SULBACTAM Value in next row Intermediate      8 SENSITIVEThis is a modified FDA-approved test that has been validated and its performance characteristics determined by the reporting laboratory.  This laboratory is certified under the Clinical Laboratory Improvement Amendments CLIA as qualified to perform high complexity clinical laboratory testing.    PIP/TAZO Value in next row Sensitive      <=4 SENSITIVEThis is a modified FDA-approved test that has been validated and its performance characteristics determined by the reporting laboratory.  This laboratory is certified under the Clinical Laboratory Improvement Amendments CLIA as qualified to perform high complexity clinical laboratory testing.    MEROPENEM Value in next row Sensitive      <=4 SENSITIVEThis is a modified FDA-approved test that has been validated and its performance characteristics determined by the reporting laboratory.  This laboratory is certified under the Clinical Laboratory Improvement Amendments CLIA as qualified to perform high complexity clinical laboratory testing.    * >=100,000 COLONIES/mL ESCHERICHIA COLI  Gastrointestinal Panel by PCR , Stool     Status: None   Collection Time: 04/10/24  3:45 PM   Specimen: Stool  Result Value Ref Range Status   Campylobacter species NOT DETECTED NOT DETECTED Final   Plesimonas  shigelloides NOT DETECTED NOT DETECTED Final   Salmonella species NOT DETECTED NOT DETECTED Final   Yersinia enterocolitica NOT DETECTED NOT DETECTED Final   Vibrio species NOT DETECTED NOT DETECTED Final   Vibrio cholerae NOT DETECTED NOT DETECTED Final   Enteroaggregative E coli (EAEC) NOT DETECTED NOT DETECTED Final   Enteropathogenic E coli (EPEC) NOT DETECTED NOT DETECTED Final   Enterotoxigenic E coli (ETEC) NOT DETECTED NOT DETECTED Final   Shiga like toxin producing E coli (STEC) NOT DETECTED NOT DETECTED Final   Shigella/Enteroinvasive E coli (EIEC) NOT DETECTED NOT DETECTED Final   Cryptosporidium NOT DETECTED NOT DETECTED Final   Cyclospora cayetanensis NOT DETECTED NOT DETECTED Final   Entamoeba histolytica NOT DETECTED NOT DETECTED Final   Giardia lamblia NOT DETECTED NOT DETECTED Final   Adenovirus F40/41 NOT DETECTED NOT DETECTED Final   Astrovirus NOT DETECTED NOT DETECTED Final   Norovirus GI/GII NOT DETECTED NOT DETECTED Final   Rotavirus A NOT DETECTED NOT DETECTED Final   Sapovirus (I, II, IV, and V) NOT DETECTED NOT DETECTED Final    Comment: Performed at [facility], [address] [place], [place] [address]     Radiology Studies: No results found.  Scheduled Meds:  amiodarone   400 mg Oral BID   Followed by   NOREEN ON 04/21/2024] amiodarone   200 mg Oral Daily   apixaban   5 mg Oral BID   bisoprolol   5 mg Oral Daily   Continuous Infusions:   LOS: 4 days  MDM: Patient is high risk for one or more organ failure.  They necessitate ongoing hospitalization for continued IV therapies and subsequent lab monitoring. Total time spent interpreting labs and vitals, reviewing the medical record, coordinating care amongst consultants and care team members, directly assessing and discussing care with the patient and/or family: 55 min  Jayshawn Colston, DO [facility]  To contact the attending physician between 7A-7P please use Epic Chat. To contact the  covering physician during after hours 7P-7A, please review  Amion.  04/13/2024, 1:51 PM   *This document has been created with the assistance of dictation software. Please excuse typographical errors. *

## 2024-04-13 NOTE — Plan of Care (Signed)

## 2024-04-13 NOTE — Plan of Care (Signed)
  Problem: Clinical Measurements: Goal: Ability to maintain clinical measurements within normal limits will improve Outcome: Progressing   Problem: Elimination: Goal: Will not experience complications related to bowel motility Outcome: Progressing   Problem: Safety: Goal: Ability to remain free from injury will improve Outcome: Progressing   

## 2024-04-14 ENCOUNTER — Other Ambulatory Visit: Payer: Self-pay

## 2024-04-14 DIAGNOSIS — N179 Acute kidney failure, unspecified: Secondary | ICD-10-CM | POA: Diagnosis not present

## 2024-04-14 NOTE — Plan of Care (Signed)
  Problem: Clinical Measurements: Goal: Ability to maintain clinical measurements within normal limits will improve Outcome: Progressing   Problem: Elimination: Goal: Will not experience complications related to bowel motility Outcome: Progressing   Problem: Safety: Goal: Ability to remain free from injury will improve Outcome: Progressing   

## 2024-04-14 NOTE — Plan of Care (Signed)
 Pt alert oriented x 4, pt ambulatory, +1 assistance. Pt c/o pain to left flank, prn tylenol  given, effective. Pt ambulated to bathroom. No distress noted.     Problem: Education: Goal: Knowledge of General Education information will improve Description: Including pain rating scale, medication(s)/side effects and non-pharmacologic comfort measures Outcome: Progressing   Problem: Health Behavior/Discharge Planning: Goal: Ability to manage health-related needs will improve Outcome: Progressing   Problem: Clinical Measurements: Goal: Ability to maintain clinical measurements within normal limits will improve Outcome: Progressing Goal: Will remain free from infection Outcome: Progressing Goal: Diagnostic test results will improve Outcome: Progressing Goal: Respiratory complications will improve Outcome: Progressing Goal: Cardiovascular complication will be avoided Outcome: Progressing   Problem: Activity: Goal: Risk for activity intolerance will decrease Outcome: Progressing   Problem: Nutrition: Goal: Adequate nutrition will be maintained Outcome: Progressing   Problem: Coping: Goal: Level of anxiety will decrease Outcome: Progressing   Problem: Elimination: Goal: Will not experience complications related to bowel motility Outcome: Progressing Goal: Will not experience complications related to urinary retention Outcome: Progressing   Problem: Pain Managment: Goal: General experience of comfort will improve and/or be controlled Outcome: Progressing   Problem: Safety: Goal: Ability to remain free from injury will improve Outcome: Progressing   Problem: Skin Integrity: Goal: Risk for impaired skin integrity will decrease Outcome: Progressing

## 2024-04-14 NOTE — Progress Notes (Signed)
 PROGRESS NOTE    Tracy Haas  FMW:[id number] DOB: Oct 19, 1945 DOA: 04/08/2024 PCP: Cleotilde Oneil FALCON, MD  Chief Complaint  Patient presents with   [facility] Course:  Tracy Haas is a 78 year old female with hypertension, heart failure with preserved EF, who was admitted with UTI and AKI in setting of acute gastroenteritis.  Patient's been having diarrhea for a week prior to arrival with worsening weakness.  On arrival patient a creatinine of 2.89 up from her baseline of 0.9 with metabolic acidosis, bicarb 10, anion gap 19.  EKG revealed A-fib.  CT abdomen pelvis was nonacute.  UA consistent with UTI.  Patient received ceftriaxone  and LR.  During the admission she went into rapid A-fib on 11/23 with heart rates between 130 and 180.  She was started on an amiodarone  drip and converted to sinus rhythm.  She was started on long-term anticoagulation and gradually transition to p.o. amiodarone  with cardiology.  Her AKI gradually resolved.  Diarrhea also resolved.  Due to her hospital course and deconditioning she is weak beyond her baseline.  Physical therapy is recommending skilled nursing.  We are now pending placement.  Subjective: No acute events overnight.  Patient remains hesitant about going home and is awaiting SNF placement.  She is anxious for an update today  Objective: Vitals:   04/13/24 1601 04/13/24 2010 04/14/24 0002 04/14/24 0452  BP: 122/66 (!) 157/82 139/89 (!) 148/67  Pulse: 71 66    Resp:  17    Temp: 98.9 F (37.2 C) 98.4 F (36.9 C) 98.1 F (36.7 C) 98.5 F (36.9 C)  TempSrc: Oral Oral Oral Oral  SpO2: 98%     Weight:      Height:        Intake/Output Summary (Last 24 hours) at 04/14/2024 1423 Last data filed at 04/14/2024 1340 Gross per 24 hour  Intake 720 ml  Output --  Net 720 ml   Filed Weights   04/08/24 1754  Weight: 49.6 kg    Examination: General exam: Appears calm and comfortable, NAD  Respiratory system: No work of breathing,  symmetric chest wall expansion Cardiovascular system: S1 & S2 heard, RRR.  Gastrointestinal system: Abdomen is nondistended, soft and nontender.  Neuro: Alert and oriented. No focal neurological deficits. Extremities: Symmetric, expected ROM Skin: No rashes, lesions Psychiatry: Demonstrates appropriate judgement and insight. Mood & affect appropriate for situation.   Assessment & Plan:  Principal Problem:   AKI (acute kidney injury) Active Problems:   High anion gap metabolic acidosis   Urinary tract infection   Acute gastroenteritis   Elevated lipase   Possible A-fib on EKG, with PVCs   Generalized weakness   Chronic heart failure with preserved ejection fraction (HFpEF) (HCC)   Essential hypertension    Paroxysmal A-fib with RVR - New onset - Developed rapid A-fib overnight 11/23 up to 180 - Initially on amiodarone  drip, now NSR - CHA2DS2-VASc: 4 - Continue with Eliquis  twice daily - Now transitioning to oral amiodarone  - Cardiology has been following - Echo: LVEF 60 to 65%, grade 1 diastolic dysfunction.  Moderate aortic valve with tricuspid valve regurg  AKI High anion gap metabolic acidosis - Secondary to GI losses from diarrhea, likely prerenal and some degree of ATN given she was also on diuretic and ACE inhibitor - Creatinine 2.89 on arrival up from baseline of 0.9.  Bicarb 10, anion gap 19.  BUN elevated at 103. - Status post bicarb and IV fluids - Encourage  oral intake now - Creatinine resolved now  Demand ischemia - Troponin elevation initially concerning for myocardial infarction - Now thought to be secondary to tachycardia.  No concern of ACS at this time per cardiology - LVEF preserved with no evidence of wall motion abnormalities.  UTI - Status post 4 days of IV Rocephin , completed course with Keflex . - Urine culture with  E. coli sensitive to cephalosporins  Acute gastroenteritis, now resolved Elevated lipase, not consistent with pancreatitis - CT  abdomen pelvis without acute findings - Lipase likely secondary to gastroenteritis,  Generalized weakness - Secondary to all of the above.  Weaker than baseline - PT/OT recommending SNF - TOC consulted for SNF placement  Chronic heart failure with preserved EF - Gradually resume home meds as needed - As needed diuretics.  Not needed today.  Clinically euvolemic  Hypertension - Resume bisoprolol   Hypokalemia Hypomagnesemia - Secondary to GI losses - Supplement as needed.  Follow CMP  Body mass index is 22.08 kg/m. - Outpatient follow up for lifestyle modification and risk factor management  Anemia - Hemoglobin on arrival 12.8, has been slowly downtrending.  Suspect this is hemodilution and I suspect the initial sample was a dehydrated sample - Continue to monitor closely - No overt signs of bleeding - Hemoglobin appears stable. -Iron studies: Low iron, low TIBC, low TSAT ratio, high ferritin.  Consistent with anemia of chronic disease.  DVT prophylaxis: eliqius   Code Status: Full Code Disposition:  Inpatient pending SNF  Consultants:  Treatment Team:  Consulting Physician: Dewane Shiner, DO  Procedures:    Antimicrobials:  Anti-infectives (From admission, onward)    Start     Dose/Rate Route Frequency Ordered Stop   04/12/24 0600  cephALEXin  (KEFLEX ) capsule 500 mg        500 mg Oral Every 8 hours 04/11/24 1004 04/13/24 0557   04/08/24 2230  cefTRIAXone  (ROCEPHIN ) 1 g in sodium chloride  0.9 % 100 mL IVPB        1 g 200 mL/hr over 30 Minutes Intravenous Every 24 hours 04/08/24 2224 04/11/24 1239   04/08/24 2130  cefTRIAXone  (ROCEPHIN ) 1 g in sodium chloride  0.9 % 100 mL IVPB  Status:  Discontinued        1 g 200 mL/hr over 30 Minutes Intravenous  Once 04/08/24 2127 04/08/24 2228       Data Reviewed: I have personally reviewed following labs and imaging studies CBC: Recent Labs  Lab 04/08/24 1803 04/09/24 1103 04/10/24 0331 04/11/24 0400  04/12/24 0410  WBC 9.4 7.9 6.0 4.8 6.4  NEUTROABS 7.9*  --   --   --   --   HGB 12.8 10.8* 10.1* 9.1* 9.3*  HCT 36.1 29.8* 28.5* 27.3* 27.6*  MCV 88.3 86.6 88.2 94.1 91.7  PLT 197 159 138* 134* 128*   Basic Metabolic Panel: Recent Labs  Lab 04/08/24 1803 04/09/24 1103 04/10/24 0010 04/10/24 0331 04/11/24 0400 04/11/24 0841 04/12/24 0410  NA 135 137 138 139 139  --  136  K 3.9 3.4* 3.6 3.8 3.4*  --  4.5  CL 107 106 104 108 107  --  106  CO2 10* 18* 23 21* 22  --  22  GLUCOSE 157* 114* 120* 112* 108*  --  103*  BUN 103* 88* 70* 63* 35*  --  21  CREATININE 2.89* 1.91* 1.64* 1.46* 1.12*  --  0.83  CALCIUM 9.3 8.4* 8.0* 7.9* 7.7*  --  7.9*  MG 1.8  --  2.5*  2.4  --  1.6*  --    GFR: Estimated Creatinine Clearance: 38.1 mL/min (by C-G formula based on SCr of 0.83 mg/dL). Liver Function Tests: Recent Labs  Lab 04/08/24 1803  AST <10*  ALT 6  ALKPHOS 80  BILITOT 0.7  PROT 6.6  ALBUMIN 3.7   CBG: No results for input(s): GLUCAP in the last 168 hours.  Recent Results (from the past 240 hours)  Urine Culture     Status: Abnormal   Collection Time: 04/08/24  8:20 PM   Specimen: Urine, Clean Catch  Result Value Ref Range Status   Specimen Description   Final    URINE, CLEAN CATCH Performed at [facility], [address] [place], [place] [address]    Special Requests   Final    NONE Performed at [facility], [address] [place], [place] [address]    Culture >=100,000 COLONIES/mL ESCHERICHIA COLI (A)  Final   Report Status 04/12/2024 FINAL  Final   Organism ID, Bacteria ESCHERICHIA COLI (A)  Final      Susceptibility   Escherichia coli - MIC*    AMPICILLIN >=32 RESISTANT Resistant     CEFAZOLIN  (URINE) Value in next row Sensitive      8 SENSITIVEThis is a modified FDA-approved test that has been validated and its performance characteristics determined by the reporting laboratory.  This laboratory is certified under the Clinical Laboratory  Improvement Amendments CLIA as qualified to perform high complexity clinical laboratory testing.    CEFEPIME Value in next row Sensitive      8 SENSITIVEThis is a modified FDA-approved test that has been validated and its performance characteristics determined by the reporting laboratory.  This laboratory is certified under the Clinical Laboratory Improvement Amendments CLIA as qualified to perform high complexity clinical laboratory testing.    ERTAPENEM Value in next row Sensitive      8 SENSITIVEThis is a modified FDA-approved test that has been validated and its performance characteristics determined by the reporting laboratory.  This laboratory is certified under the Clinical Laboratory Improvement Amendments CLIA as qualified to perform high complexity clinical laboratory testing.    CEFTRIAXONE  Value in next row Sensitive      8 SENSITIVEThis is a modified FDA-approved test that has been validated and its performance characteristics determined by the reporting laboratory.  This laboratory is certified under the Clinical Laboratory Improvement Amendments CLIA as qualified to perform high complexity clinical laboratory testing.    CIPROFLOXACIN  Value in next row Resistant      8 SENSITIVEThis is a modified FDA-approved test that has been validated and its performance characteristics determined by the reporting laboratory.  This laboratory is certified under the Clinical Laboratory Improvement Amendments CLIA as qualified to perform high complexity clinical laboratory testing.    GENTAMICIN Value in next row Resistant      8 SENSITIVEThis is a modified FDA-approved test that has been validated and its performance characteristics determined by the reporting laboratory.  This laboratory is certified under the Clinical Laboratory Improvement Amendments CLIA as qualified to perform high complexity clinical laboratory testing.    NITROFURANTOIN Value in next row Sensitive      8 SENSITIVEThis is a  modified FDA-approved test that has been validated and its performance characteristics determined by the reporting laboratory.  This laboratory is certified under the Clinical Laboratory Improvement Amendments CLIA as qualified to perform high complexity clinical laboratory testing.    TRIMETH/SULFA Value in next row Sensitive  8 SENSITIVEThis is a modified FDA-approved test that has been validated and its performance characteristics determined by the reporting laboratory.  This laboratory is certified under the Clinical Laboratory Improvement Amendments CLIA as qualified to perform high complexity clinical laboratory testing.    AMPICILLIN/SULBACTAM Value in next row Intermediate      8 SENSITIVEThis is a modified FDA-approved test that has been validated and its performance characteristics determined by the reporting laboratory.  This laboratory is certified under the Clinical Laboratory Improvement Amendments CLIA as qualified to perform high complexity clinical laboratory testing.    PIP/TAZO Value in next row Sensitive      <=4 SENSITIVEThis is a modified FDA-approved test that has been validated and its performance characteristics determined by the reporting laboratory.  This laboratory is certified under the Clinical Laboratory Improvement Amendments CLIA as qualified to perform high complexity clinical laboratory testing.    MEROPENEM Value in next row Sensitive      <=4 SENSITIVEThis is a modified FDA-approved test that has been validated and its performance characteristics determined by the reporting laboratory.  This laboratory is certified under the Clinical Laboratory Improvement Amendments CLIA as qualified to perform high complexity clinical laboratory testing.    * >=100,000 COLONIES/mL ESCHERICHIA COLI  Gastrointestinal Panel by PCR , Stool     Status: None   Collection Time: 04/10/24  3:45 PM   Specimen: Stool  Result Value Ref Range Status   Campylobacter species NOT DETECTED NOT  DETECTED Final   Plesimonas shigelloides NOT DETECTED NOT DETECTED Final   Salmonella species NOT DETECTED NOT DETECTED Final   Yersinia enterocolitica NOT DETECTED NOT DETECTED Final   Vibrio species NOT DETECTED NOT DETECTED Final   Vibrio cholerae NOT DETECTED NOT DETECTED Final   Enteroaggregative E coli (EAEC) NOT DETECTED NOT DETECTED Final   Enteropathogenic E coli (EPEC) NOT DETECTED NOT DETECTED Final   Enterotoxigenic E coli (ETEC) NOT DETECTED NOT DETECTED Final   Shiga like toxin producing E coli (STEC) NOT DETECTED NOT DETECTED Final   Shigella/Enteroinvasive E coli (EIEC) NOT DETECTED NOT DETECTED Final   Cryptosporidium NOT DETECTED NOT DETECTED Final   Cyclospora cayetanensis NOT DETECTED NOT DETECTED Final   Entamoeba histolytica NOT DETECTED NOT DETECTED Final   Giardia lamblia NOT DETECTED NOT DETECTED Final   Adenovirus F40/41 NOT DETECTED NOT DETECTED Final   Astrovirus NOT DETECTED NOT DETECTED Final   Norovirus GI/GII NOT DETECTED NOT DETECTED Final   Rotavirus A NOT DETECTED NOT DETECTED Final   Sapovirus (I, II, IV, and V) NOT DETECTED NOT DETECTED Final    Comment: Performed at [facility], [address], [place], [place] [address]     Radiology Studies: No results found.  Scheduled Meds:  amiodarone   400 mg Oral BID   Followed by   NOREEN ON 04/21/2024] amiodarone   200 mg Oral Daily   apixaban   5 mg Oral BID   bisoprolol   5 mg Oral Daily   Continuous Infusions:   LOS: 5 days  MDM: Patient is high risk for one or more organ failure.  They necessitate ongoing hospitalization for continued IV therapies and subsequent lab monitoring. Total time spent interpreting labs and vitals, reviewing the medical record, coordinating care amongst consultants and care team members, directly assessing and discussing care with the patient and/or family: 55 min  Rocklin Soderquist, DO [facility]  To contact the attending physician between 7A-7P please  use Epic Chat. To contact the covering physician during after hours 7P-7A,  please review Amion.  04/14/2024, 2:23 PM   *This document has been created with the assistance of dictation software. Please excuse typographical errors. *

## 2024-04-14 NOTE — Progress Notes (Signed)
 Occupational Therapy Treatment Patient Details Name: Tracy Haas MRN: [id number] DOB: [birth year] Today's Date: 04/14/2024   History of present illness Tracy Haas is a 78 y.o. female with medical history significant for Hypertension, HFpEF with moderate AI and MR, being admitted with urinary tract infection and acute kidney injury in the setting of acute enteritis.  Patient has been having  diarrhea for the past week with worsening weakness.  On the day of arrival, she was found on the floor by her son awake and alert.  States she was so weak she lowered herself to the floor after using the bathroom.  Denies falling or hitting her head.   OT comments  Patient seen for OT treatment on this date. Upon arrival to room patient resting in recliner, agreeable to treatment. OT discussed PT note stating that patient could go home with home health- patient became agitated about this feeling like you're just trying to rush me out the door because y'all need the bed. OT explained that's not the role of OT/PT, role is to ensure discharge disposition is most appropriate for patient based on current level of assist needed and amount of support that patients have available to them. Patient continued to perseverate on this for entire session and was difficult to redirect. Patient does not feel ready to dc home, OT agreeable to SNF recommendation. Patient performed in room mobility, ambulated 50 feet in hallway with r/w, performed grooming and toileting tasks (see below for level of A).  Patient ended treatment in recliner with chair alarm on and all needs within reach. Patient making good progress toward goals, will continue to follow POC. Discharge recommendation remains appropriate.        If plan is discharge home, recommend the following:  A little help with walking and/or transfers;A little help with bathing/dressing/bathroom;Assistance with cooking/housework;Assist for transportation;Help with stairs or  ramp for entrance   Equipment Recommendations  Other (comment)    Recommendations for Other Services      Precautions / Restrictions Precautions Precautions: Fall Recall of Precautions/Restrictions: Intact Restrictions Weight Bearing Restrictions Per Provider Order: No       Mobility Bed Mobility                    Transfers Overall transfer level: Needs assistance Equipment used: Rolling walker (2 wheels) Transfers: Sit to/from Stand Sit to Stand: Supervision                 Balance Overall balance assessment: Needs assistance Sitting-balance support: No upper extremity supported, Feet supported Sitting balance-Leahy Scale: Good Sitting balance - Comments: able to balance in sitting without UE support   Standing balance support: Bilateral upper extremity supported Standing balance-Leahy Scale: Fair Standing balance comment: fair static while at sink for grooming                           ADL either performed or assessed with clinical judgement   ADL Overall ADL's : Needs assistance/impaired     Grooming: Wash/dry face;Wash/dry hands;Oral care;Standing;Supervision/safety                   Toilet Transfer: Supervision/safety;Regular Toilet;Rolling walker (2 wheels)   Toileting- Architect and Hygiene: Supervision/safety;Sit to/from stand              Extremity/Trunk Assessment Upper Extremity Assessment Upper Extremity Assessment: Overall WFL for tasks assessed   Lower Extremity Assessment Lower Extremity Assessment: Defer  to PT evaluation        Vision       Perception     Praxis     Communication Communication Communication: Impaired Factors Affecting Communication: Hearing impaired   Cognition Arousal: Alert Behavior During Therapy: WFL for tasks assessed/performed Cognition: No family/caregiver present to determine baseline             OT - Cognition Comments: a bit slow to process but  also HOH                 Following commands: Intact Following commands impaired: Follows one step commands with increased time      Cueing   Cueing Techniques: Verbal cues  Exercises      Shoulder Instructions       General Comments      Pertinent Vitals/ Pain       Pain Assessment Pain Assessment: No/denies pain  Home Living                                          Prior Functioning/Environment              Frequency  Min 2X/week        Progress Toward Goals  OT Goals(current goals can now be found in the care plan section)  Progress towards OT goals: Progressing toward goals  Acute Rehab OT Goals Patient Stated Goal: to get stronger OT Goal Formulation: With patient Time For Goal Achievement: 04/26/24 Potential to Achieve Goals: Good ADL Goals Pt Will Perform Lower Body Dressing: with modified independence;sitting/lateral leans;sit to/from stand Pt Will Transfer to Toilet: with modified independence;ambulating Pt Will Perform Toileting - Clothing Manipulation and hygiene: with modified independence;sitting/lateral leans;sit to/from stand Additional ADL Goal #1: Pt will verbalize plan to implement at least 2 learned falls prevention/EC strategies to maximize safety.  Plan      Co-evaluation                 AM-PAC OT 6 Clicks Daily Activity     Outcome Measure   Help from another person eating meals?: None Help from another person taking care of personal grooming?: A Little Help from another person toileting, which includes using toliet, bedpan, or urinal?: A Little Help from another person bathing (including washing, rinsing, drying)?: A Little Help from another person to put on and taking off regular upper body clothing?: None Help from another person to put on and taking off regular lower body clothing?: A Little 6 Click Score: 20    End of Session Equipment Utilized During Treatment: Rolling walker (2  wheels)  OT Visit Diagnosis: Unsteadiness on feet (R26.81);History of falling (Z91.81)   Activity Tolerance Patient tolerated treatment well   Patient Left in chair;with call bell/phone within reach;with chair alarm set   Nurse Communication Mobility status        Time: 8787-8758 OT Time Calculation (min): 29 min  Charges: OT General Charges $OT Visit: 1 Visit OT Treatments $Self Care/Home Management : 23-37 mins  Rogers Clause, OT/L MSOT, 04/14/2024

## 2024-04-14 NOTE — TOC Progression Note (Signed)
 Transition of Care [facility]) - Progression Note    Patient Details  Name: Tracy Haas MRN: [id number] Date of Birth: Sep 22, 1945  [facility] CM/SW Contact  Alfonso Rummer, LCSW Phone Number: 04/14/2024, 3:38 PM  Clinical Narrative:     LCSW A. Rummer returned pt son phone call and provided update regarding skilled nursing facility location.   Expected Discharge Plan: Skilled Nursing Facility Barriers to Discharge: Continued Medical Work up               Expected Discharge Plan and Services In-house Referral: Clinical Social Work                                             Social Drivers of Health (SDOH) Interventions SDOH Screenings   Food Insecurity: No Food Insecurity (04/09/2024)  Housing: Low Risk  (04/09/2024)  Transportation Needs: No Transportation Needs (04/09/2024)  Utilities: Not At Risk (04/09/2024)  Financial Resource Strain: Low Risk  (03/07/2024)   Received from [facility]  Social Connections: Socially Integrated (04/09/2024)  Tobacco Use: Low Risk  (04/09/2024)    Readmission Risk Interventions     No data to display

## 2024-04-15 DIAGNOSIS — N179 Acute kidney failure, unspecified: Secondary | ICD-10-CM | POA: Diagnosis not present

## 2024-04-15 MED ORDER — AMIODARONE HCL 200 MG PO TABS
400.0000 mg | ORAL_TABLET | Freq: Two times a day (BID) | ORAL | Status: DC
  Administered 2024-04-15 – 2024-04-16 (×2): 400 mg via ORAL
  Filled 2024-04-15 (×2): qty 2

## 2024-04-15 MED ORDER — AMIODARONE HCL 200 MG PO TABS: 200.0000 mg | ORAL_TABLET | Freq: Every day | ORAL | Status: DC

## 2024-04-15 NOTE — Progress Notes (Signed)
 Report called to [place], Charity fundraiser.

## 2024-04-15 NOTE — Plan of Care (Signed)
 Pt is alert oriented x 4. RA. Pt ambulated to bathroom with walker and stand by assistance. Pt c/o pain to back, prn tylenol  given, effective. No distress noted. Call button within reach.    Problem: Education: Goal: Knowledge of General Education information will improve Description: Including pain rating scale, medication(s)/side effects and non-pharmacologic comfort measures Outcome: Progressing   Problem: Health Behavior/Discharge Planning: Goal: Ability to manage health-related needs will improve Outcome: Progressing   Problem: Clinical Measurements: Goal: Ability to maintain clinical measurements within normal limits will improve Outcome: Progressing Goal: Will remain free from infection Outcome: Progressing Goal: Diagnostic test results will improve Outcome: Progressing Goal: Respiratory complications will improve Outcome: Progressing Goal: Cardiovascular complication will be avoided Outcome: Progressing   Problem: Activity: Goal: Risk for activity intolerance will decrease Outcome: Progressing   Problem: Nutrition: Goal: Adequate nutrition will be maintained Outcome: Progressing   Problem: Coping: Goal: Level of anxiety will decrease Outcome: Progressing   Problem: Elimination: Goal: Will not experience complications related to bowel motility Outcome: Progressing Goal: Will not experience complications related to urinary retention Outcome: Progressing   Problem: Pain Managment: Goal: General experience of comfort will improve and/or be controlled Outcome: Progressing   Problem: Safety: Goal: Ability to remain free from injury will improve Outcome: Progressing   Problem: Skin Integrity: Goal: Risk for impaired skin integrity will decrease Outcome: Progressing

## 2024-04-15 NOTE — Progress Notes (Signed)
 Physical Therapy Treatment Patient Details Name: Tracy Haas MRN: [id number] DOB: Dec 30, 1945 Today's Date: 04/15/2024   History of Present Illness Tracy Haas is a 78 y.o. female with medical history significant for Hypertension, HFpEF with moderate AI and MR, being admitted with urinary tract infection and acute kidney injury in the setting of acute enteritis.  Patient has been having  diarrhea for the past week with worsening weakness.  On the day of arrival, she was found on the floor by her son awake and alert.  States she was so weak she lowered herself to the floor after using the bathroom.  Denies falling or hitting her head.    PT Comments  Patient received up in recliner. She is agreeable to PT session. Patient stands with mod I. Ambulated around nursing station with RW and supervision/mod I. No LOB or difficulty noted/reported during ambulation. Decreased gait speed. She will continue to benefit from mobility while here at hospital for improved independence and strength.     If plan is discharge home, recommend the following: A little help with bathing/dressing/bathroom;A little help with walking and/or transfers   Can travel by private vehicle     Yes  Equipment Recommendations  Rolling walker (2 wheels);BSC/3in1    Recommendations for Other Services       Precautions / Restrictions Precautions Precautions: None Recall of Precautions/Restrictions: Intact Restrictions Weight Bearing Restrictions Per Provider Order: No     Mobility  Bed Mobility               General bed mobility comments: NT patient in recliner    Transfers Overall transfer level: Modified independent Equipment used: Rolling walker (2 wheels) Transfers: Sit to/from Stand Sit to Stand: Modified independent (Device/Increase time)                Ambulation/Gait Ambulation/Gait assistance: Modified independent (Device/Increase time) Gait Distance (Feet): 170 Feet Assistive  device: Rolling walker (2 wheels) Gait Pattern/deviations: Step-through pattern, Decreased step length - right, Decreased step length - left, Decreased stride length Gait velocity: decr     General Gait Details: no lob, or difficulty ambulating this session   Stairs             Wheelchair Mobility     Tilt Bed    Modified Rankin (Stroke Patients Only)       Balance Overall balance assessment: Modified Independent Sitting-balance support: Feet supported Sitting balance-Leahy Scale: Normal     Standing balance support: Bilateral upper extremity supported, During functional activity, Reliant on assistive device for balance Standing balance-Leahy Scale: Good                              Communication Communication Communication: Impaired Factors Affecting Communication: Hearing impaired  Cognition Arousal: Alert Behavior During Therapy: WFL for tasks assessed/performed   PT - Cognitive impairments: No apparent impairments                         Following commands: Intact Following commands impaired: Follows one step commands with increased time    Cueing Cueing Techniques: Verbal cues  Exercises      General Comments        Pertinent Vitals/Pain Pain Assessment Pain Assessment: No/denies pain    Home Living Family/patient expects to be discharged to:: Private residence  Prior Function            PT Goals (current goals can now be found in the care plan section) Acute Rehab PT Goals Patient Stated Goal: to get stronger PT Goal Formulation: With patient Time For Goal Achievement: 04/25/24 Potential to Achieve Goals: Good Progress towards PT goals: Progressing toward goals    Frequency    Min 2X/week      PT Plan      Co-evaluation              AM-PAC PT 6 Clicks Mobility   Outcome Measure  Help needed turning from your back to your side while in a flat bed without using  bedrails?: None Help needed moving from lying on your back to sitting on the side of a flat bed without using bedrails?: None Help needed moving to and from a bed to a chair (including a wheelchair)?: None Help needed standing up from a chair using your arms (e.g., wheelchair or bedside chair)?: None Help needed to walk in hospital room?: A Little Help needed climbing 3-5 steps with a railing? : A Little 6 Click Score: 22    End of Session   Activity Tolerance: Patient tolerated treatment well Patient left: in chair;with call bell/phone within reach;with chair alarm set Nurse Communication: Mobility status PT Visit Diagnosis: Muscle weakness (generalized) (M62.81)     Time: 8556-8544 PT Time Calculation (min) (ACUTE ONLY): 12 min  Charges:    $Gait Training: 8-22 mins PT General Charges $$ ACUTE PT VISIT: 1 Visit                     Caylea Foronda, PT, GCS 04/15/24,3:06 PM

## 2024-04-15 NOTE — Progress Notes (Signed)
 PROGRESS NOTE    Tracy Haas  FMW:[id number] DOB: 1946/01/12 DOA: 04/08/2024 PCP: Cleotilde Oneil FALCON, MD  Chief Complaint  Patient presents with   [facility] Hospital Course:  Tracy Haas is a 78 year old female with hypertension, heart failure with preserved EF, who was admitted with UTI and AKI in setting of acute gastroenteritis.  Patient's been having diarrhea for a week prior to arrival with worsening weakness.  On arrival patient a creatinine of 2.89 up from her baseline of 0.9 with metabolic acidosis, bicarb 10, anion gap 19.  EKG revealed A-fib.  CT abdomen pelvis was nonacute.  UA consistent with UTI.  Patient received ceftriaxone  and LR.  During the admission she went into rapid A-fib on 11/23 with heart rates between 130 and 180.  She was started on an amiodarone  drip and converted to sinus rhythm.  She was started on long-term anticoagulation and gradually transition to p.o. amiodarone  with cardiology.  Her AKI gradually resolved.  Diarrhea also resolved.  Due to her hospital course and deconditioning she is weak beyond her baseline.  Physical therapy is recommending skilled nursing.  We are now pending placement.  Subjective: No acute events overnight. Per my discussion with physical and Occupational Therapy teams today patient has had significant improvement since admission and will likely no longer qualify for skilled nursing facility.  I have discussed home health as an option with the patient. She is very hesitant due to lack of social support in the area, but does report she was able to walk around the nurses station easily and has been getting to the bathroom independently.  Objective: Vitals:   04/15/24 0028 04/15/24 0448 04/15/24 0906 04/15/24 1226  BP: 121/65 (!) 157/72 (!) 143/67 113/61  Pulse: (!) 56 68 63 66  Resp: 14 18 16 18   Temp: 97.9 F (36.6 C) 98 F (36.7 C) 97.9 F (36.6 C) 98.3 F (36.8 C)  TempSrc: Oral Oral  Oral  SpO2: 99% 99% 100% 98%  Weight:       Height:        Intake/Output Summary (Last 24 hours) at 04/15/2024 1642 Last data filed at 04/15/2024 1410 Gross per 24 hour  Intake 728 ml  Output --  Net 728 ml   Filed Weights   04/08/24 1754  Weight: 49.6 kg    Examination: General exam: Appears calm and comfortable, NAD  Respiratory system: No work of breathing, symmetric chest wall expansion Cardiovascular system: S1 & S2 heard, RRR.  Gastrointestinal system: Abdomen is nondistended, soft and nontender.  Neuro: Alert and oriented. No focal neurological deficits. Extremities: Symmetric, expected ROM Skin: No rashes, lesions Psychiatry: Demonstrates appropriate judgement and insight. Mood & affect appropriate for situation.   Assessment & Plan:  Principal Problem:   AKI (acute kidney injury) Active Problems:   High anion gap metabolic acidosis   Urinary tract infection   Acute gastroenteritis   Elevated lipase   Possible A-fib on EKG, with PVCs   Generalized weakness   Chronic heart failure with preserved ejection fraction (HFpEF) (HCC)   Essential hypertension    Paroxysmal A-fib with RVR - New onset - Developed rapid A-fib overnight 11/23 up to 180 - Initially on amiodarone  drip, now NSR - CHA2DS2-VASc: 4 - Continue with Eliquis  twice daily - Now transitioning to oral amiodarone  - Cardiology has been following - Echo: LVEF 60 to 65%, grade 1 diastolic dysfunction.  Moderate aortic valve with tricuspid valve regurg  AKI High anion gap metabolic  acidosis - Secondary to GI losses from diarrhea, likely prerenal and some degree of ATN given she was also on diuretic and ACE inhibitor - Creatinine 2.89 on arrival up from baseline of 0.9.  Bicarb 10, anion gap 19.  BUN elevated at 103. - Status post bicarb and IV fluids - Encourage oral intake now - Creatinine resolved now  Demand ischemia - Troponin elevation initially concerning for myocardial infarction - Now thought to be secondary to tachycardia.  No  concern of ACS at this time per cardiology - LVEF preserved with no evidence of wall motion abnormalities.  UTI - Status post 4 days of IV Rocephin , completed course with Keflex . - Urine culture with  E. coli sensitive to cephalosporins  Acute gastroenteritis, now resolved Elevated lipase, not consistent with pancreatitis - CT abdomen pelvis without acute findings - Lipase likely secondary to gastroenteritis,  Generalized weakness - Secondary to all of the above.  Weaker than baseline - PT/OT recommended SNF.  Patient has progressed significantly awaiting SNF placement.  She is now going to discharge home with home health.  She would rather discharge home tomorrow than today as it is already late today and home health has not yet been arranged.  Chronic heart failure with preserved EF - Gradually resume home meds as needed - As needed diuretics.  Not needed today.  Clinically euvolemic  Hypertension - Resume bisoprolol   Hypokalemia Hypomagnesemia - Secondary to GI losses - Supplement as needed.  Follow CMP  Body mass index is 22.08 kg/m. - Outpatient follow up for lifestyle modification and risk factor management  Anemia - Hemoglobin on arrival 12.8, has been slowly downtrending.  Suspect this is hemodilution and I suspect the initial sample was a dehydrated sample - Continue to monitor closely - No overt signs of bleeding - Hemoglobin appears stable. -Iron studies: Low iron, low TIBC, low TSAT ratio, high ferritin.  Consistent with anemia of chronic disease.  DVT prophylaxis: eliqius   Code Status: Full Code Disposition: Now planning to discharge home with home health.  Home health has been ordered.  DC home in a.m.  Consultants:  Treatment Team:  Consulting Physician: Dewane Shiner, DO  Procedures:    Antimicrobials:  Anti-infectives (From admission, onward)    Start     Dose/Rate Route Frequency Ordered Stop   04/12/24 0600  cephALEXin  (KEFLEX ) capsule 500  mg        500 mg Oral Every 8 hours 04/11/24 1004 04/13/24 0557   04/08/24 2230  cefTRIAXone  (ROCEPHIN ) 1 g in sodium chloride  0.9 % 100 mL IVPB        1 g 200 mL/hr over 30 Minutes Intravenous Every 24 hours 04/08/24 2224 04/11/24 1239   04/08/24 2130  cefTRIAXone  (ROCEPHIN ) 1 g in sodium chloride  0.9 % 100 mL IVPB  Status:  Discontinued        1 g 200 mL/hr over 30 Minutes Intravenous  Once 04/08/24 2127 04/08/24 2228       Data Reviewed: I have personally reviewed following labs and imaging studies CBC: Recent Labs  Lab 04/08/24 1803 04/09/24 1103 04/10/24 0331 04/11/24 0400 04/12/24 0410  WBC 9.4 7.9 6.0 4.8 6.4  NEUTROABS 7.9*  --   --   --   --   HGB 12.8 10.8* 10.1* 9.1* 9.3*  HCT 36.1 29.8* 28.5* 27.3* 27.6*  MCV 88.3 86.6 88.2 94.1 91.7  PLT 197 159 138* 134* 128*   Basic Metabolic Panel: Recent Labs  Lab 04/08/24 1803 04/09/24 1103  04/10/24 0010 04/10/24 0331 04/11/24 0400 04/11/24 0841 04/12/24 0410  NA 135 137 138 139 139  --  136  K 3.9 3.4* 3.6 3.8 3.4*  --  4.5  CL 107 106 104 108 107  --  106  CO2 10* 18* 23 21* 22  --  22  GLUCOSE 157* 114* 120* 112* 108*  --  103*  BUN 103* 88* 70* 63* 35*  --  21  CREATININE 2.89* 1.91* 1.64* 1.46* 1.12*  --  0.83  CALCIUM 9.3 8.4* 8.0* 7.9* 7.7*  --  7.9*  MG 1.8  --  2.5* 2.4  --  1.6*  --    GFR: Estimated Creatinine Clearance: 38.1 mL/min (by C-G formula based on SCr of 0.83 mg/dL). Liver Function Tests: Recent Labs  Lab 04/08/24 1803  AST <10*  ALT 6  ALKPHOS 80  BILITOT 0.7  PROT 6.6  ALBUMIN 3.7   CBG: No results for input(s): GLUCAP in the last 168 hours.  Recent Results (from the past 240 hours)  Urine Culture     Status: Abnormal   Collection Time: 04/08/24  8:20 PM   Specimen: Urine, Clean Catch  Result Value Ref Range Status   Specimen Description   Final    URINE, CLEAN CATCH Performed at [facility], [address] [place], [place] [address]    Special Requests    Final    NONE Performed at [facility], [address] [place], [place] [address]    Culture >=100,000 COLONIES/mL ESCHERICHIA COLI (A)  Final   Report Status 04/12/2024 FINAL  Final   Organism ID, Bacteria ESCHERICHIA COLI (A)  Final      Susceptibility   Escherichia coli - MIC*    AMPICILLIN >=32 RESISTANT Resistant     CEFAZOLIN  (URINE) Value in next row Sensitive      8 SENSITIVEThis is a modified FDA-approved test that has been validated and its performance characteristics determined by the reporting laboratory.  This laboratory is certified under the Clinical Laboratory Improvement Amendments CLIA as qualified to perform high complexity clinical laboratory testing.    CEFEPIME Value in next row Sensitive      8 SENSITIVEThis is a modified FDA-approved test that has been validated and its performance characteristics determined by the reporting laboratory.  This laboratory is certified under the Clinical Laboratory Improvement Amendments CLIA as qualified to perform high complexity clinical laboratory testing.    ERTAPENEM Value in next row Sensitive      8 SENSITIVEThis is a modified FDA-approved test that has been validated and its performance characteristics determined by the reporting laboratory.  This laboratory is certified under the Clinical Laboratory Improvement Amendments CLIA as qualified to perform high complexity clinical laboratory testing.    CEFTRIAXONE  Value in next row Sensitive      8 SENSITIVEThis is a modified FDA-approved test that has been validated and its performance characteristics determined by the reporting laboratory.  This laboratory is certified under the Clinical Laboratory Improvement Amendments CLIA as qualified to perform high complexity clinical laboratory testing.    CIPROFLOXACIN  Value in next row Resistant      8 SENSITIVEThis is a modified FDA-approved test that has been validated and its performance characteristics determined by the  reporting laboratory.  This laboratory is certified under the Clinical Laboratory Improvement Amendments CLIA as qualified to perform high complexity clinical laboratory testing.    GENTAMICIN Value in next row Resistant      8 SENSITIVEThis is  a modified FDA-approved test that has been validated and its performance characteristics determined by the reporting laboratory.  This laboratory is certified under the Clinical Laboratory Improvement Amendments CLIA as qualified to perform high complexity clinical laboratory testing.    NITROFURANTOIN Value in next row Sensitive      8 SENSITIVEThis is a modified FDA-approved test that has been validated and its performance characteristics determined by the reporting laboratory.  This laboratory is certified under the Clinical Laboratory Improvement Amendments CLIA as qualified to perform high complexity clinical laboratory testing.    TRIMETH/SULFA Value in next row Sensitive      8 SENSITIVEThis is a modified FDA-approved test that has been validated and its performance characteristics determined by the reporting laboratory.  This laboratory is certified under the Clinical Laboratory Improvement Amendments CLIA as qualified to perform high complexity clinical laboratory testing.    AMPICILLIN/SULBACTAM Value in next row Intermediate      8 SENSITIVEThis is a modified FDA-approved test that has been validated and its performance characteristics determined by the reporting laboratory.  This laboratory is certified under the Clinical Laboratory Improvement Amendments CLIA as qualified to perform high complexity clinical laboratory testing.    PIP/TAZO Value in next row Sensitive      <=4 SENSITIVEThis is a modified FDA-approved test that has been validated and its performance characteristics determined by the reporting laboratory.  This laboratory is certified under the Clinical Laboratory Improvement Amendments CLIA as qualified to perform high complexity clinical  laboratory testing.    MEROPENEM Value in next row Sensitive      <=4 SENSITIVEThis is a modified FDA-approved test that has been validated and its performance characteristics determined by the reporting laboratory.  This laboratory is certified under the Clinical Laboratory Improvement Amendments CLIA as qualified to perform high complexity clinical laboratory testing.    * >=100,000 COLONIES/mL ESCHERICHIA COLI  Gastrointestinal Panel by PCR , Stool     Status: None   Collection Time: 04/10/24  3:45 PM   Specimen: Stool  Result Value Ref Range Status   Campylobacter species NOT DETECTED NOT DETECTED Final   Plesimonas shigelloides NOT DETECTED NOT DETECTED Final   Salmonella species NOT DETECTED NOT DETECTED Final   Yersinia enterocolitica NOT DETECTED NOT DETECTED Final   Vibrio species NOT DETECTED NOT DETECTED Final   Vibrio cholerae NOT DETECTED NOT DETECTED Final   Enteroaggregative E coli (EAEC) NOT DETECTED NOT DETECTED Final   Enteropathogenic E coli (EPEC) NOT DETECTED NOT DETECTED Final   Enterotoxigenic E coli (ETEC) NOT DETECTED NOT DETECTED Final   Shiga like toxin producing E coli (STEC) NOT DETECTED NOT DETECTED Final   Shigella/Enteroinvasive E coli (EIEC) NOT DETECTED NOT DETECTED Final   Cryptosporidium NOT DETECTED NOT DETECTED Final   Cyclospora cayetanensis NOT DETECTED NOT DETECTED Final   Entamoeba histolytica NOT DETECTED NOT DETECTED Final   Giardia lamblia NOT DETECTED NOT DETECTED Final   Adenovirus F40/41 NOT DETECTED NOT DETECTED Final   Astrovirus NOT DETECTED NOT DETECTED Final   Norovirus GI/GII NOT DETECTED NOT DETECTED Final   Rotavirus A NOT DETECTED NOT DETECTED Final   Sapovirus (I, II, IV, and V) NOT DETECTED NOT DETECTED Final    Comment: Performed at [facility], [address], [place], [place] [address]     Radiology Studies: No results found.  Scheduled Meds:  amiodarone   400 mg Oral BID   Followed by   NOREEN ON 04/21/2024]  amiodarone   200 mg Oral Daily  apixaban   5 mg Oral BID   bisoprolol   5 mg Oral Daily   Continuous Infusions:   LOS: 6 days  MDM: Patient is high risk for one or more organ failure.  They necessitate ongoing hospitalization for continued IV therapies and subsequent lab monitoring. Total time spent interpreting labs and vitals, reviewing the medical record, coordinating care amongst consultants and care team members, directly assessing and discussing care with the patient and/or family: 55 min  Karletta Millay, DO [facility]  To contact the attending physician between 7A-7P please use Epic Chat. To contact the covering physician during after hours 7P-7A, please review Amion.  04/15/2024, 4:42 PM   *This document has been created with the assistance of dictation software. Please excuse typographical errors. *

## 2024-04-16 ENCOUNTER — Other Ambulatory Visit: Payer: Self-pay

## 2024-04-16 DIAGNOSIS — N179 Acute kidney failure, unspecified: Secondary | ICD-10-CM | POA: Diagnosis not present

## 2024-04-16 LAB — TSH: TSH: 1.77 u[IU]/mL (ref 0.350–4.500)

## 2024-04-16 MED ORDER — AMIODARONE HCL 200 MG PO TABS
200.0000 mg | ORAL_TABLET | Freq: Every day | ORAL | 0 refills | Status: DC
  Filled 2024-04-16: qty 30, 30d supply, fill #0

## 2024-04-16 MED ORDER — BISOPROLOL FUMARATE 5 MG PO TABS
5.0000 mg | ORAL_TABLET | Freq: Every day | ORAL | 0 refills | Status: AC
  Filled 2024-04-16: qty 30, 30d supply, fill #0

## 2024-04-16 MED ORDER — APIXABAN 5 MG PO TABS
5.0000 mg | ORAL_TABLET | Freq: Two times a day (BID) | ORAL | 0 refills | Status: AC
  Filled 2024-04-16: qty 60, 30d supply, fill #0
  Filled 2024-05-17 – 2024-05-19 (×2): qty 60, 30d supply, fill #1

## 2024-04-16 NOTE — Discharge Instructions (Signed)
 Home Health services have been arranged with [facility]. You will receive a call 24-48 hours after discharge to schedule the initial in-home assessment. If you do not receive a call, please give them a call at [phone].   Durable Medical Equipment has been recommended to you. [facility] is processing the order for a rolling walker and a bedside commode. Per your request, the equipment will be delivered to your home. You will receive a call to schedule a delivery time.

## 2024-04-16 NOTE — Progress Notes (Signed)
 PT Cancellation Note  Patient Details Name: CORALINE TALWAR MRN: [id number] DOB: [birth year]   Cancelled Treatment:    Reason Eval/Treat Not Completed: Other (comment)  Offered session.  Pt awaiting discharge and declined further interventions at this time.   Lauraine Gills 04/16/2024, 1:55 PM

## 2024-04-16 NOTE — Discharge Summary (Signed)
 DISCHARGE SUMMARY    Tracy Haas FMW:[id number] DOB: [birth year] DOA: 04/08/2024  PCP: Cleotilde Oneil FALCON, MD  Admit date: 04/08/2024 Discharge date: 04/16/2024   Recommendations for Outpatient Follow-up:  Follow up with PCP in 1-2 weeks to continue chronic condition management. Follow-up with cardiology as scheduled  Hospital Course: Tracy Haas is a 78 year old female with hypertension, heart failure with preserved EF, who was admitted with UTI and AKI in setting of acute gastroenteritis.  Patient's been having diarrhea for a week prior to arrival with worsening weakness.  On arrival patient a creatinine of 2.89 up from her baseline of 0.9 with metabolic acidosis, bicarb 10, anion gap 19.  EKG revealed A-fib.  CT abdomen pelvis was nonacute.  UA consistent with UTI.  Patient received ceftriaxone  and LR.  During the admission she went into rapid A-fib on 11/23 with heart rates between 130 and 180.  Cardiology was consulted.  She was started on an amiodarone  drip and converted to sinus rhythm.  She was started on long-term anticoagulation and gradually transition to p.o. amiodarone .  Her AKI gradually resolved.  Diarrhea also resolved.  Due to her hospital course and deconditioning she is weak beyond her baseline.  PT/OT services initially recommended skilled nursing facility placement.  Discharge was delayed pending these arrangements.  While awaiting skilled nursing set up patient continued to improve with physical therapy to the point that recommendations changed.  She is now discharging home today with home health PT/OT/home health aide.  On day of discharge patient endorsed feeling ready for discharge. Her son was at bedside and we discussed care plan. Meds to beds delivered medications prior to DC.  TOC arranged home health services and DME    Paroxysmal A-fib with RVR - New onset - Developed rapid A-fib overnight 11/23 up to 180 - Initially on amiodarone  drip, now NSR -  CHA2DS2-VASc: 4 - Continue with Eliquis  twice daily - Amiodarone  prescribed at DC.  Follow-up with cardiology patient - Echo: LVEF 60 to 65%, grade 1 diastolic dysfunction.  Moderate aortic valve with tricuspid valve regurg   AKI High anion gap metabolic acidosis - Secondary to GI losses from diarrhea, likely prerenal and some degree of ATN given she was also on diuretic and ACE inhibitor - Creatinine 2.89 on arrival up from baseline of 0.9.  Bicarb 10, anion gap 19.  BUN elevated at 103. - Status post bicarb and IV fluids - Encourage oral intake now - Creatinine resolved now   Demand ischemia - Troponin elevation initially concerning for myocardial infarction - Now thought to be secondary to tachycardia.  No concern of ACS at this time per cardiology - LVEF preserved with no evidence of wall motion abnormalities.   UTI - Status post 4 days of IV Rocephin , completed course with Keflex . - Urine culture with  E. coli sensitive to cephalosporins   Acute gastroenteritis, now resolved Elevated lipase, not consistent with pancreatitis - CT abdomen pelvis without acute findings - Lipase likely secondary to gastroenteritis   Generalized weakness - Secondary to all of the above.  Weaker than baseline - PT/OT recommended SNF.  Patient has progressed significantly awaiting SNF placement.  She is now going to discharge home with home health. - DME arranged prior to DC   Chronic heart failure with preserved EF - Gradually resume home meds as needed - As needed diuretics.  Not needed today.  Clinically euvolemic   Hypertension - Resume bisoprolol    Hypokalemia Hypomagnesemia - Secondary to GI losses -  Supplement as needed.  Follow CMP   Body mass index is 22.08 kg/m. - Outpatient follow up for lifestyle modification and risk factor management   Anemia - Hemoglobin on arrival 12.8, has been slowly downtrending.  Suspect this is hemodilution and I suspect the initial sample was a  dehydrated sample - Continue to monitor closely - No overt signs of bleeding - Hemoglobin appears stable. -Iron studies: Low iron, low TIBC, low TSAT ratio, high ferritin.  Consistent with anemia of chronic disease.  discharge Instructions  Discharge Instructions     Call MD for:  difficulty breathing, headache or visual disturbances   Complete by: As directed    Call MD for:  persistant dizziness or light-headedness   Complete by: As directed    Call MD for:  persistant nausea and vomiting   Complete by: As directed    Call MD for:  severe uncontrolled pain   Complete by: As directed    Call MD for:  temperature >100.4   Complete by: As directed    Diet general   Complete by: As directed    Discharge instructions   Complete by: As directed    Follow up with your primary care physician to discuss the medication changes during this admission.  Please also keep your follow-up appointments with cardiology as scheduled   Increase activity slowly   Complete by: As directed       Allergies as of 04/16/2024   No Known Allergies      Medication List     STOP taking these medications    ibuprofen 200 MG tablet Commonly known as: ADVIL   potassium chloride  10 MEQ tablet Commonly known as: KLOR-CON    ramipril  10 MG capsule Commonly known as: ALTACE    Ziac  5-6.25 MG tablet Generic drug: bisoprolol -hydrochlorothiazide        TAKE these medications    amiodarone  200 MG tablet Commonly known as: Pacerone  Take 1 tablet (200 mg total) by mouth daily.   ascorbic acid 500 MG tablet Commonly known as: VITAMIN C Take 500 mg by mouth daily.   bisoprolol  5 MG tablet Commonly known as: ZEBETA  Take 1 tablet (5 mg total) by mouth daily.   Calcium 500 + D3 250-500 MG-UNIT Chew Generic drug: Ca Phosphate-Cholecalciferol Chew 2 each by mouth daily.   Eliquis  5 MG Tabs tablet Generic drug: apixaban  Take 1 tablet (5 mg total) by mouth 2 (two) times daily.   levothyroxine   75 MCG tablet Commonly known as: SYNTHROID  Take 1 tablet by mouth daily before breakfast.   omeprazole 20 MG capsule Commonly known as: PRILOSEC Take 1 capsule by mouth daily.   RA Vitamin B-12 TR 1000 MCG Tbcr Generic drug: Cyanocobalamin Take 1 tablet by mouth daily.   simvastatin  20 MG tablet Commonly known as: ZOCOR  Take 20 mg by mouth daily.   torsemide  20 MG tablet Commonly known as: DEMADEX  Take 10 mg by mouth once.        Contact information for follow-up providers     Custovic, Sabina, DO. Go in 1 week(s).   Specialty: Cardiology Contact information: [address] [place] [address] [phone]              Contact information for after-discharge care     Home Medical Care     [facility] [place] .   Service: Home Health Services Contact information: [phone] [place]  [phone]  No Known Allergies  Consultations: Treatment Team:  Custovic, Sabina, DO   Procedures/Studies: ECHOCARDIOGRAM COMPLETE Result Date: 04/10/2024    ECHOCARDIOGRAM REPORT   Patient Name:   AVNI TRAORE Date of Exam: 04/10/2024 Medical Rec #:  [id number]      Height:       59.0 in Accession #:    [id number]     Weight:       109.3 lb Date of Birth:  25-Aug-1945     BSA:          1.427 m Patient Age:    26 years       BP:           109/68 mmHg Patient Gender: F              HR:           83 bpm. Exam Location:  [facility] Procedure: 2D Echo, Color Doppler and Cardiac Doppler (Both Spectral and Color            Flow Doppler were utilized during procedure). Indications:     Atrial Fibrillation I48.91  History:         Patient has no prior history of Echocardiogram examinations.                  Risk Factors:Hypertension and Dyslipidemia. Panic attacks.  Sonographer:     Christopher Furnace Referring Phys:  [id number] DELAYNE LULLA SOLIAN Diagnosing Phys: Annalee Custovic IMPRESSIONS  1. Left ventricular ejection fraction, by  estimation, is 60 to 65%. The left ventricle has normal function. The left ventricle has no regional wall motion abnormalities. Left ventricular diastolic parameters are consistent with Grade I diastolic dysfunction (impaired relaxation).  2. Right ventricular systolic function is normal. The right ventricular size is normal.  3. The mitral valve is normal in structure. Mild mitral valve regurgitation. No evidence of mitral stenosis.  4. Tricuspid valve regurgitation is moderate.  5. The aortic valve is normal in structure. Aortic valve regurgitation is moderate. Aortic valve sclerosis/calcification is present, without any evidence of aortic stenosis. Aortic regurgitation PHT measures 402 msec.  6. The inferior vena cava is normal in size with greater than 50% respiratory variability, suggesting right atrial pressure of 3 mmHg. FINDINGS  Left Ventricle: Left ventricular ejection fraction, by estimation, is 60 to 65%. The left ventricle has normal function. The left ventricle has no regional wall motion abnormalities. The left ventricular internal cavity size was normal in size. There is  no left ventricular hypertrophy. Left ventricular diastolic parameters are consistent with Grade I diastolic dysfunction (impaired relaxation). Right Ventricle: The right ventricular size is normal. No increase in right ventricular wall thickness. Right ventricular systolic function is normal. Left Atrium: Left atrial size was normal in size. Right Atrium: Right atrial size was normal in size. Pericardium: There is no evidence of pericardial effusion. Mitral Valve: The mitral valve is normal in structure. Mild mitral valve regurgitation. No evidence of mitral valve stenosis. MV peak gradient, 5.8 mmHg. The mean mitral valve gradient is 2.0 mmHg. Tricuspid Valve: The tricuspid valve is normal in structure. Tricuspid valve regurgitation is moderate. Aortic Valve: The aortic valve is normal in structure. Aortic valve regurgitation is  moderate. Aortic regurgitation PHT measures 402 msec. Aortic valve sclerosis/calcification is present, without any evidence of aortic stenosis. Aortic valve mean gradient measures 2.0 mmHg. Aortic valve peak gradient measures 4.2 mmHg. Aortic valve area, by VTI measures 2.42 cm. Pulmonic Valve: The pulmonic valve was normal  in structure. Pulmonic valve regurgitation is mild. Aorta: The aortic root is normal in size and structure. Venous: The inferior vena cava is normal in size with greater than 50% respiratory variability, suggesting right atrial pressure of 3 mmHg. IAS/Shunts: No atrial level shunt detected by color flow Doppler.  LEFT VENTRICLE PLAX 2D LVIDd:         4.00 cm   Diastology LVIDs:         2.30 cm   LV e' medial:    11.10 cm/s LV PW:         0.90 cm   LV E/e' medial:  8.8 LV IVS:        0.90 cm   LV e' lateral:   15.70 cm/s LVOT diam:     2.00 cm   LV E/e' lateral: 6.2 LV SV:         42 LV SV Index:   29 LVOT Area:     3.14 cm LV IVRT:       95 msec  RIGHT VENTRICLE RV Basal diam:  3.50 cm     PULMONARY VEINS RV Mid diam:    2.30 cm     Diastolic Velocity: 19.40 cm/s RV S prime:     15.90 cm/s  S/D Velocity:       2.20 TAPSE (M-mode): 2.5 cm      Systolic Velocity:  41.90 cm/s LEFT ATRIUM           Index        RIGHT ATRIUM           Index LA diam:      3.10 cm 2.17 cm/m   RA Area:     15.10 cm LA Vol (A2C): 17.4 ml 12.20 ml/m  RA Volume:   36.90 ml  25.86 ml/m LA Vol (A4C): 27.4 ml 19.21 ml/m  AORTIC VALVE AV Area (Vmax):    2.78 cm AV Area (Vmean):   2.67 cm AV Area (VTI):     2.42 cm AV Vmax:           103.00 cm/s AV Vmean:          65.500 cm/s AV VTI:            0.173 m AV Peak Grad:      4.2 mmHg AV Mean Grad:      2.0 mmHg LVOT Vmax:         91.00 cm/s LVOT Vmean:        55.600 cm/s LVOT VTI:          0.133 m LVOT/AV VTI ratio: 0.77 AI PHT:            402 msec  AORTA Ao Root diam: 3.20 cm MITRAL VALVE               TRICUSPID VALVE MV Area (PHT): 5.58 cm    TR Peak grad:   51.6 mmHg  MV Area VTI:   1.64 cm    TR Vmax:        359.00 cm/s MV Peak grad:  5.8 mmHg MV Mean grad:  2.0 mmHg    SHUNTS MV Vmax:       1.20 m/s    Systemic VTI:  0.13 m MV Vmean:      67.4 cm/s   Systemic Diam: 2.00 cm MV Decel Time: 136 msec MV E velocity: 98.00 cm/s MV A velocity: 66.60 cm/s MV E/A ratio:  1.47 Designer, Multimedia signed by Annalee  Custovic Signature Date/Time: 04/10/2024/3:18:30 PM    Final    CT ABDOMEN PELVIS WO CONTRAST Result Date: 04/08/2024 CLINICAL DATA:  Nausea/vomiting/diarrhea, found down EXAM: CT ABDOMEN AND PELVIS WITHOUT CONTRAST TECHNIQUE: Multidetector CT imaging of the abdomen and pelvis was performed following the standard protocol without IV contrast. RADIATION DOSE REDUCTION: This exam was performed according to the departmental dose-optimization program which includes automated exposure control, adjustment of the mA and/or kV according to patient size and/or use of iterative reconstruction technique. COMPARISON:  02/19/2023 FINDINGS: Lower chest: No acute pleural or parenchymal lung disease. Hepatobiliary: Unremarkable unenhanced appearance of the liver and gallbladder. Pancreas: Unremarkable unenhanced appearance. Spleen: Unremarkable unenhanced appearance. Adrenals/Urinary Tract: No urinary tract calculi or obstructive uropathy within either kidney. The adrenals are unremarkable. Bladder is grossly normal. Stomach/Bowel: Postsurgical changes from prior small bowel resection and partial sigmoid colon resection. No bowel obstruction or ileus. No bowel wall thickening or inflammatory change. Vascular/Lymphatic: Aortic atherosclerosis. No enlarged abdominal or pelvic lymph nodes. Reproductive: Status post hysterectomy. No adnexal masses. Other: No free fluid or free intraperitoneal gas. No abdominal wall hernia. Musculoskeletal: No acute or destructive bony abnormalities. Reconstructed images demonstrate no additional findings. IMPRESSION: 1. No acute intra-abdominal or  intrapelvic process. 2.  Aortic Atherosclerosis (ICD10-I70.0). Electronically Signed   By: Ozell Daring M.D.   On: 04/08/2024 20:01      Discharge Exam: Vitals:   04/16/24 0403 04/16/24 0757  BP: (!) 143/70 (!) 151/69  Pulse: (!) 59 (!) 56  Resp: 16 17  Temp: 97.9 F (36.6 C) 98 F (36.7 C)  SpO2: 97% 100%   Vitals:   04/15/24 1811 04/15/24 2021 04/16/24 0403 04/16/24 0757  BP: 139/65 133/63 (!) 143/70 (!) 151/69  Pulse: 71 61 (!) 59 (!) 56  Resp: 15 16 16 17   Temp: 98.8 F (37.1 C) 98.8 F (37.1 C) 97.9 F (36.6 C) 98 F (36.7 C)  TempSrc:    Oral  SpO2: 95% 97% 97% 100%  Weight:      Height:        Constitutional:  Normal appearance. Non toxic-appearing.  HENT: Head Normocephalic and atraumatic.  Mucous membranes are moist.  Eyes:  Extraocular intact. Conjunctivae normal.  Cardiovascular: Rate and Rhythm: Normal rate and regular rhythm.  Pulmonary: Non labored, symmetric rise of chest wall.  Skin: warm and dry. not jaundiced.  Neurological: No focal deficit present. alert. Oriented.  Psychiatric: Mood and Affect congruent.    The results of significant diagnostics from this hospitalization (including imaging, microbiology, ancillary and laboratory) are listed below for reference.     Microbiology: Recent Results (from the past 240 hours)  Urine Culture     Status: Abnormal   Collection Time: 04/08/24  8:20 PM   Specimen: Urine, Clean Catch  Result Value Ref Range Status   Specimen Description   Final    URINE, CLEAN CATCH Performed at [facility], [address], [place], [place] [address]    Special Requests   Final    NONE Performed at [facility], [address] [place], [place] [address]    Culture >=100,000 COLONIES/mL ESCHERICHIA COLI (A)  Final   Report Status 04/12/2024 FINAL  Final   Organism ID, Bacteria ESCHERICHIA COLI (A)  Final      Susceptibility   Escherichia coli - MIC*    AMPICILLIN >=32 RESISTANT Resistant      CEFAZOLIN  (URINE) Value in next row Sensitive      8 SENSITIVEThis is a modified  FDA-approved test that has been validated and its performance characteristics determined by the reporting laboratory.  This laboratory is certified under the Clinical Laboratory Improvement Amendments CLIA as qualified to perform high complexity clinical laboratory testing.    CEFEPIME Value in next row Sensitive      8 SENSITIVEThis is a modified FDA-approved test that has been validated and its performance characteristics determined by the reporting laboratory.  This laboratory is certified under the Clinical Laboratory Improvement Amendments CLIA as qualified to perform high complexity clinical laboratory testing.    ERTAPENEM Value in next row Sensitive      8 SENSITIVEThis is a modified FDA-approved test that has been validated and its performance characteristics determined by the reporting laboratory.  This laboratory is certified under the Clinical Laboratory Improvement Amendments CLIA as qualified to perform high complexity clinical laboratory testing.    CEFTRIAXONE  Value in next row Sensitive      8 SENSITIVEThis is a modified FDA-approved test that has been validated and its performance characteristics determined by the reporting laboratory.  This laboratory is certified under the Clinical Laboratory Improvement Amendments CLIA as qualified to perform high complexity clinical laboratory testing.    CIPROFLOXACIN  Value in next row Resistant      8 SENSITIVEThis is a modified FDA-approved test that has been validated and its performance characteristics determined by the reporting laboratory.  This laboratory is certified under the Clinical Laboratory Improvement Amendments CLIA as qualified to perform high complexity clinical laboratory testing.    GENTAMICIN Value in next row Resistant      8 SENSITIVEThis is a modified FDA-approved test that has been validated and its performance characteristics determined by  the reporting laboratory.  This laboratory is certified under the Clinical Laboratory Improvement Amendments CLIA as qualified to perform high complexity clinical laboratory testing.    NITROFURANTOIN Value in next row Sensitive      8 SENSITIVEThis is a modified FDA-approved test that has been validated and its performance characteristics determined by the reporting laboratory.  This laboratory is certified under the Clinical Laboratory Improvement Amendments CLIA as qualified to perform high complexity clinical laboratory testing.    TRIMETH/SULFA Value in next row Sensitive      8 SENSITIVEThis is a modified FDA-approved test that has been validated and its performance characteristics determined by the reporting laboratory.  This laboratory is certified under the Clinical Laboratory Improvement Amendments CLIA as qualified to perform high complexity clinical laboratory testing.    AMPICILLIN/SULBACTAM Value in next row Intermediate      8 SENSITIVEThis is a modified FDA-approved test that has been validated and its performance characteristics determined by the reporting laboratory.  This laboratory is certified under the Clinical Laboratory Improvement Amendments CLIA as qualified to perform high complexity clinical laboratory testing.    PIP/TAZO Value in next row Sensitive      <=4 SENSITIVEThis is a modified FDA-approved test that has been validated and its performance characteristics determined by the reporting laboratory.  This laboratory is certified under the Clinical Laboratory Improvement Amendments CLIA as qualified to perform high complexity clinical laboratory testing.    MEROPENEM Value in next row Sensitive      <=4 SENSITIVEThis is a modified FDA-approved test that has been validated and its performance characteristics determined by the reporting laboratory.  This laboratory is certified under the Clinical Laboratory Improvement Amendments CLIA as qualified to perform high complexity  clinical laboratory testing.    * >=100,000 COLONIES/mL ESCHERICHIA COLI  Gastrointestinal Panel by  PCR , Stool     Status: None   Collection Time: 04/10/24  3:45 PM   Specimen: Stool  Result Value Ref Range Status   Campylobacter species NOT DETECTED NOT DETECTED Final   Plesimonas shigelloides NOT DETECTED NOT DETECTED Final   Salmonella species NOT DETECTED NOT DETECTED Final   Yersinia enterocolitica NOT DETECTED NOT DETECTED Final   Vibrio species NOT DETECTED NOT DETECTED Final   Vibrio cholerae NOT DETECTED NOT DETECTED Final   Enteroaggregative E coli (EAEC) NOT DETECTED NOT DETECTED Final   Enteropathogenic E coli (EPEC) NOT DETECTED NOT DETECTED Final   Enterotoxigenic E coli (ETEC) NOT DETECTED NOT DETECTED Final   Shiga like toxin producing E coli (STEC) NOT DETECTED NOT DETECTED Final   Shigella/Enteroinvasive E coli (EIEC) NOT DETECTED NOT DETECTED Final   Cryptosporidium NOT DETECTED NOT DETECTED Final   Cyclospora cayetanensis NOT DETECTED NOT DETECTED Final   Entamoeba histolytica NOT DETECTED NOT DETECTED Final   Giardia lamblia NOT DETECTED NOT DETECTED Final   Adenovirus F40/41 NOT DETECTED NOT DETECTED Final   Astrovirus NOT DETECTED NOT DETECTED Final   Norovirus GI/GII NOT DETECTED NOT DETECTED Final   Rotavirus A NOT DETECTED NOT DETECTED Final   Sapovirus (I, II, IV, and V) NOT DETECTED NOT DETECTED Final    Comment: Performed at [facility], [address] [place], [place] [address]     Labs: BNP (last 3 results) No results for input(s): BNP in the last 8760 hours. Basic Metabolic Panel: Recent Labs  Lab 04/10/24 0010 04/10/24 0331 04/11/24 0400 04/11/24 0841 04/12/24 0410  NA 138 139 139  --  136  K 3.6 3.8 3.4*  --  4.5  CL 104 108 107  --  106  CO2 23 21* 22  --  22  GLUCOSE 120* 112* 108*  --  103*  BUN 70* 63* 35*  --  21  CREATININE 1.64* 1.46* 1.12*  --  0.83  CALCIUM 8.0* 7.9* 7.7*  --  7.9*  MG 2.5* 2.4  --  1.6*  --     Liver Function Tests: No results for input(s): AST, ALT, ALKPHOS, BILITOT, PROT, ALBUMIN in the last 168 hours. No results for input(s): LIPASE, AMYLASE in the last 168 hours. No results for input(s): AMMONIA in the last 168 hours. CBC: Recent Labs  Lab 04/10/24 0331 04/11/24 0400 04/12/24 0410  WBC 6.0 4.8 6.4  HGB 10.1* 9.1* 9.3*  HCT 28.5* 27.3* 27.6*  MCV 88.2 94.1 91.7  PLT 138* 134* 128*   Cardiac Enzymes: No results for input(s): CKTOTAL, CKMB, CKMBINDEX, TROPONINI in the last 168 hours. BNP: Invalid input(s): POCBNP CBG: No results for input(s): GLUCAP in the last 168 hours. D-Dimer No results for input(s): DDIMER in the last 72 hours. Hgb A1c No results for input(s): HGBA1C in the last 72 hours. Lipid Profile No results for input(s): CHOL, HDL, LDLCALC, TRIG, CHOLHDL, LDLDIRECT in the last 72 hours. Thyroid function studies Recent Labs    04/16/24 0950  TSH 1.770   Anemia work up No results for input(s): VITAMINB12, FOLATE, FERRITIN, TIBC, IRON, RETICCTPCT in the last 72 hours. Urinalysis    Component Value Date/Time   COLORURINE YELLOW (A) 04/08/2024 2020   APPEARANCEUR HAZY (A) 04/08/2024 2020   LABSPEC 1.013 04/08/2024 2020   PHURINE 5.0 04/08/2024 2020   GLUCOSEU NEGATIVE 04/08/2024 2020   HGBUR MODERATE (A) 04/08/2024 2020   BILIRUBINUR NEGATIVE 04/08/2024 2020   KETONESUR NEGATIVE 04/08/2024 2020  PROTEINUR NEGATIVE 04/08/2024 2020   NITRITE POSITIVE (A) 04/08/2024 2020   LEUKOCYTESUR MODERATE (A) 04/08/2024 2020   Sepsis Labs Recent Labs  Lab 04/10/24 0331 04/11/24 0400 04/12/24 0410  WBC 6.0 4.8 6.4   Microbiology Recent Results (from the past 240 hours)  Urine Culture     Status: Abnormal   Collection Time: 04/08/24  8:20 PM   Specimen: Urine, Clean Catch  Result Value Ref Range Status   Specimen Description   Final    URINE, CLEAN CATCH Performed at [facility], [address] [place], [place] [address]    Special Requests   Final    NONE Performed at [facility], [address] [place], [place] [address]    Culture >=100,000 COLONIES/mL ESCHERICHIA COLI (A)  Final   Report Status 04/12/2024 FINAL  Final   Organism ID, Bacteria ESCHERICHIA COLI (A)  Final      Susceptibility   Escherichia coli - MIC*    AMPICILLIN >=32 RESISTANT Resistant     CEFAZOLIN  (URINE) Value in next row Sensitive      8 SENSITIVEThis is a modified FDA-approved test that has been validated and its performance characteristics determined by the reporting laboratory.  This laboratory is certified under the Clinical Laboratory Improvement Amendments CLIA as qualified to perform high complexity clinical laboratory testing.    CEFEPIME Value in next row Sensitive      8 SENSITIVEThis is a modified FDA-approved test that has been validated and its performance characteristics determined by the reporting laboratory.  This laboratory is certified under the Clinical Laboratory Improvement Amendments CLIA as qualified to perform high complexity clinical laboratory testing.    ERTAPENEM Value in next row Sensitive      8 SENSITIVEThis is a modified FDA-approved test that has been validated and its performance characteristics determined by the reporting laboratory.  This laboratory is certified under the Clinical Laboratory Improvement Amendments CLIA as qualified to perform high complexity clinical laboratory testing.    CEFTRIAXONE  Value in next row Sensitive      8 SENSITIVEThis is a modified FDA-approved test that has been validated and its performance characteristics determined by the reporting laboratory.  This laboratory is certified under the Clinical Laboratory Improvement Amendments CLIA as qualified to perform high complexity clinical laboratory testing.    CIPROFLOXACIN  Value in next row Resistant      8 SENSITIVEThis is a modified FDA-approved test that has been  validated and its performance characteristics determined by the reporting laboratory.  This laboratory is certified under the Clinical Laboratory Improvement Amendments CLIA as qualified to perform high complexity clinical laboratory testing.    GENTAMICIN Value in next row Resistant      8 SENSITIVEThis is a modified FDA-approved test that has been validated and its performance characteristics determined by the reporting laboratory.  This laboratory is certified under the Clinical Laboratory Improvement Amendments CLIA as qualified to perform high complexity clinical laboratory testing.    NITROFURANTOIN Value in next row Sensitive      8 SENSITIVEThis is a modified FDA-approved test that has been validated and its performance characteristics determined by the reporting laboratory.  This laboratory is certified under the Clinical Laboratory Improvement Amendments CLIA as qualified to perform high complexity clinical laboratory testing.    TRIMETH/SULFA Value in next row Sensitive      8 SENSITIVEThis is a modified FDA-approved test that has been validated and its performance characteristics determined by the reporting laboratory.  This laboratory is  certified under the Clinical Laboratory Improvement Amendments CLIA as qualified to perform high complexity clinical laboratory testing.    AMPICILLIN/SULBACTAM Value in next row Intermediate      8 SENSITIVEThis is a modified FDA-approved test that has been validated and its performance characteristics determined by the reporting laboratory.  This laboratory is certified under the Clinical Laboratory Improvement Amendments CLIA as qualified to perform high complexity clinical laboratory testing.    PIP/TAZO Value in next row Sensitive      <=4 SENSITIVEThis is a modified FDA-approved test that has been validated and its performance characteristics determined by the reporting laboratory.  This laboratory is certified under the Clinical Laboratory Improvement  Amendments CLIA as qualified to perform high complexity clinical laboratory testing.    MEROPENEM Value in next row Sensitive      <=4 SENSITIVEThis is a modified FDA-approved test that has been validated and its performance characteristics determined by the reporting laboratory.  This laboratory is certified under the Clinical Laboratory Improvement Amendments CLIA as qualified to perform high complexity clinical laboratory testing.    * >=100,000 COLONIES/mL ESCHERICHIA COLI  Gastrointestinal Panel by PCR , Stool     Status: None   Collection Time: 04/10/24  3:45 PM   Specimen: Stool  Result Value Ref Range Status   Campylobacter species NOT DETECTED NOT DETECTED Final   Plesimonas shigelloides NOT DETECTED NOT DETECTED Final   Salmonella species NOT DETECTED NOT DETECTED Final   Yersinia enterocolitica NOT DETECTED NOT DETECTED Final   Vibrio species NOT DETECTED NOT DETECTED Final   Vibrio cholerae NOT DETECTED NOT DETECTED Final   Enteroaggregative E coli (EAEC) NOT DETECTED NOT DETECTED Final   Enteropathogenic E coli (EPEC) NOT DETECTED NOT DETECTED Final   Enterotoxigenic E coli (ETEC) NOT DETECTED NOT DETECTED Final   Shiga like toxin producing E coli (STEC) NOT DETECTED NOT DETECTED Final   Shigella/Enteroinvasive E coli (EIEC) NOT DETECTED NOT DETECTED Final   Cryptosporidium NOT DETECTED NOT DETECTED Final   Cyclospora cayetanensis NOT DETECTED NOT DETECTED Final   Entamoeba histolytica NOT DETECTED NOT DETECTED Final   Giardia lamblia NOT DETECTED NOT DETECTED Final   Adenovirus F40/41 NOT DETECTED NOT DETECTED Final   Astrovirus NOT DETECTED NOT DETECTED Final   Norovirus GI/GII NOT DETECTED NOT DETECTED Final   Rotavirus A NOT DETECTED NOT DETECTED Final   Sapovirus (I, II, IV, and V) NOT DETECTED NOT DETECTED Final    Comment: Performed at [facility], [address], [place], [place] [address]     Time coordinating discharge: 32 min   SIGNED: Lorane Poland, DO [facility] 04/16/2024, 12:47 PM Pager   If 7PM-7AM, please contact night-coverage

## 2024-04-16 NOTE — Progress Notes (Signed)
 Mobility Specialist - Progress Note   04/16/24 1200  Mobility  Activity Ambulated with assistance;Respositioned in chair  Level of Assistance Standby assist, set-up cues, supervision of patient - no hands on  Assistive Device Front wheel walker  Distance Ambulated (ft) 180 ft  Range of Motion/Exercises Active;All extremities  Activity Response Tolerated well  Mobility visit 1 Mobility  Mobility Specialist Start Time (ACUTE ONLY) 1040  Mobility Specialist Stop Time (ACUTE ONLY) 1107  Mobility Specialist Time Calculation (min) (ACUTE ONLY) 27 min   Pt was in the recliner on RA upon entry. Pt agreed to mobility. Pt is able today to STS independently with a 2 WW. Pt ambulated well throughout activity. Pt did not need a recovery break throughout activity. After activity pt returned to the room repositioned in the recliner with needs in reach.  Clem Rodes Mobility Specialist 04/16/24, 12:31 PM

## 2024-04-16 NOTE — Progress Notes (Signed)
 Patient has mobility impairment for daily activities. A rolling walker will resolve this and the patient is safe to use it

## 2024-04-16 NOTE — TOC Transition Note (Signed)
 Transition of Care [facility]) - Discharge Note   Patient Details  Name: Tracy Haas MRN: [id number] Date of Birth: Oct 27, 1945  Transition of Care [facility]) CM/SW Contact:  Doralee Kocak L Analeah Brame, LCSW Phone Number: 04/16/2024, 12:47 PM   Clinical Narrative:     Patient ready for discharge. HH orders entered. CSW attempted to review choice with patient but she advised that she wasn't able to comprehend what CSW was advising. She gave her son, Prentice, the phone. CSW inquired about preference for [facility] agencies. Prentice advised of no preferences. Home Health services have bee arranged with [facility], CSW spoke with Shaun, who confirmed start of care to be between 24 to 48 hours.   DME discussed. Prentice advised that he would like for his mother to receive the RW and the [facility]. Delivery will be to the home. Prentice is providing transportation for his mother.   No further TOC needs assessed. TOC signing off.     Barriers to Discharge: Continued Medical Work up   Patient Goals and CMS Choice Patient states their goals for this hospitalization and ongoing recovery are:: get strong enough to go back home          Discharge Placement                       Discharge Plan and Services Additional resources added to the After Visit Summary for   In-house Referral: Clinical Social Work                                   Social Drivers of Health (SDOH) Interventions SDOH Screenings   Food Insecurity: No Food Insecurity (04/09/2024)  Housing: Low Risk  (04/09/2024)  Transportation Needs: No Transportation Needs (04/09/2024)  Utilities: Not At Risk (04/09/2024)  Financial Resource Strain: Low Risk  (03/07/2024)   Received from [facility]  Social Connections: Socially Integrated (04/09/2024)  Tobacco Use: Low Risk  (04/09/2024)     Readmission Risk Interventions     No data to display

## 2024-04-16 NOTE — Progress Notes (Signed)
 Reviewed discharged instructions with patient. Patient acknowledged understanding. Patient received med to bedside. Patient discharged with personal belongings. Patient wheeled out by staff. Patient transported home via family vehicle. No distress noted in patient.

## 2024-04-16 NOTE — Plan of Care (Signed)

## 2024-04-16 NOTE — Progress Notes (Signed)
 Patient is not able to walk the distance required to go the bathroom, or he/she is unable to safely negotiate stairs required to access the bathroom.  A 3in1 BSC will alleviate this problem

## 2024-04-17 ENCOUNTER — Other Ambulatory Visit: Payer: Self-pay

## 2024-04-20 ENCOUNTER — Other Ambulatory Visit: Payer: Self-pay

## 2024-04-20 ENCOUNTER — Ambulatory Visit: Payer: Self-pay | Admitting: Cardiovascular Disease

## 2024-04-20 ENCOUNTER — Encounter: Payer: Self-pay | Admitting: Cardiovascular Disease

## 2024-04-20 VITALS — BP 150/84 | HR 71 | Ht 59.0 in | Wt 113.8 lb

## 2024-04-20 DIAGNOSIS — I5032 Chronic diastolic (congestive) heart failure: Secondary | ICD-10-CM

## 2024-04-20 DIAGNOSIS — R7989 Other specified abnormal findings of blood chemistry: Secondary | ICD-10-CM

## 2024-04-20 DIAGNOSIS — I10 Essential (primary) hypertension: Secondary | ICD-10-CM

## 2024-04-20 DIAGNOSIS — R0602 Shortness of breath: Secondary | ICD-10-CM | POA: Diagnosis not present

## 2024-04-20 DIAGNOSIS — I5033 Acute on chronic diastolic (congestive) heart failure: Secondary | ICD-10-CM

## 2024-04-20 DIAGNOSIS — R9431 Abnormal electrocardiogram [ECG] [EKG]: Secondary | ICD-10-CM

## 2024-04-20 DIAGNOSIS — I351 Nonrheumatic aortic (valve) insufficiency: Secondary | ICD-10-CM

## 2024-04-20 DIAGNOSIS — E782 Mixed hyperlipidemia: Secondary | ICD-10-CM | POA: Diagnosis not present

## 2024-04-20 DIAGNOSIS — I340 Nonrheumatic mitral (valve) insufficiency: Secondary | ICD-10-CM

## 2024-04-20 DIAGNOSIS — I4891 Unspecified atrial fibrillation: Secondary | ICD-10-CM

## 2024-04-20 MED ORDER — LOSARTAN POTASSIUM 25 MG PO TABS: 25.0000 mg | ORAL_TABLET | Freq: Every day | ORAL | 1 refills | Status: AC

## 2024-04-20 MED ORDER — SIMVASTATIN 20 MG PO TABS: 20.0000 mg | ORAL_TABLET | Freq: Every day | ORAL | 2 refills | Status: AC

## 2024-04-20 MED ORDER — TORSEMIDE 20 MG PO TABS: 10.0000 mg | ORAL_TABLET | Freq: Every day | ORAL | 2 refills | Status: AC

## 2024-04-20 MED ORDER — DAPAGLIFLOZIN PROPANEDIOL 10 MG PO TABS: 10.0000 mg | ORAL_TABLET | Freq: Every day | ORAL | 3 refills | Status: DC

## 2024-04-20 NOTE — Progress Notes (Addendum)
 Cardiology Office Note   Date:  04/20/2024   ID:  Haas, Tracy 06-Feb-1946, MRN [id number]  PCP:  Cleotilde Oneil FALCON, MD  Cardiologist:  Denyse Bathe, MD      History of Present Illness: Tracy Haas is a 78 y.o. female who presents for  Chief Complaint  Patient presents with   Consult    New onset a-fib with RVR and NSVT    77 YOWF presented to [facility] with SOB, and found to be in afib .with RVR,and HFpEF.  Currently in NSR, and has DOE, but no chest pains. Had LVEF 60%, and mild troponin elevations. Feels congested.      Past Medical History:  Diagnosis Date   Adult hypothyroidism 11/05/2013   Anxiety    B12 deficiency 11/07/2013   BP (high blood pressure) 02/11/2012   Diverticulitis    Family history of adverse reaction to anesthesia    mother- possible problems with intubation.   Fatty infiltration of liver 04/18/2012   Overview:  US  finding 03/2012    GERD (gastroesophageal reflux disease)    Hematuria 01/22/2012   High cholesterol    HLD (hyperlipidemia) 10/14/2012   Hypertension    Hypothyroidism    Panic attacks    Stenosis of intestine (HCC)      Past Surgical History:  Procedure Laterality Date   ABDOMINAL HYSTERECTOMY  1989   BREAST BIOPSY Right 1990's   neg   COLON RESECTION SIGMOID N/A 07/24/2016   Procedure: COLON RESECTION SIGMOID;  Surgeon: Carlin Pastel, MD;  Location: [facility] ORS;  Service: General;  Laterality: N/A;   COLONOSCOPY WITH PROPOFOL  N/A 04/16/2015   Procedure: COLONOSCOPY WITH PROPOFOL ;  Surgeon: Rogelia Copping, MD;  Location: [facility];  Service: Endoscopy;  Laterality: N/A;   CYSTOSCOPY WITH STENT PLACEMENT Bilateral 07/24/2016   Procedure: CYSTOSCOPY WITH STENT PLACEMENT;  Surgeon: Redell Lynwood Napoleon, MD;  Location: [facility] ORS;  Service: Urology;  Laterality: Bilateral;   LAPAROSCOPIC SIGMOID COLECTOMY N/A 07/24/2016   Procedure: LAPAROSCOPIC SIGMOID COLECTOMY;  Surgeon: Carlin Pastel, MD;  Location: [facility] ORS;  Service: General;   Laterality: N/A;   SMALL INTESTINE SURGERY  1989   Dr. Dellie- (After Hysterectomy)   TRIGGER FINGER RELEASE Right 04/15/2016   Procedure: RELEASE TRIGGER FINGER/A-1 PULLEY;  Surgeon: Norleen JINNY Maltos, MD;  Location: [facility];  Service: Orthopedics;  Laterality: Right;     Current Outpatient Medications  Medication Sig Dispense Refill   amiodarone  (PACERONE ) 200 MG tablet Take 1 tablet (200 mg total) by mouth daily. 30 tablet 0   apixaban  (ELIQUIS ) 5 MG TABS tablet Take 1 tablet (5 mg total) by mouth 2 (two) times daily. 180 tablet 0   bisoprolol  (ZEBETA ) 5 MG tablet Take 1 tablet (5 mg total) by mouth daily. 30 tablet 0   Ca Phosphate-Cholecalciferol (CALCIUM 500 + D3) 250-500 MG-UNIT CHEW Chew 2 each by mouth daily.     Cyanocobalamin (RA VITAMIN B-12 TR) 1000 MCG TBCR Take 1 tablet by mouth daily.     dapagliflozin propanediol (FARXIGA) 10 MG TABS tablet Take 1 tablet (10 mg total) by mouth daily before breakfast. 30 tablet 3   losartan (COZAAR) 25 MG tablet Take 1 tablet (25 mg total) by mouth daily. 90 tablet 1   simvastatin  (ZOCOR ) 20 MG tablet Take 1 tablet (20 mg total) by mouth daily. 30 tablet 2   torsemide (DEMADEX) 20 MG tablet Take 0.5 tablets (10 mg total) by mouth daily. 30 tablet 2  No current facility-administered medications for this visit.    Allergies:   Patient has no known allergies.    Social History:   reports that she has never smoked. She has never used smokeless tobacco. She reports that she does not drink alcohol and does not use drugs.   Family History:  family history includes Cirrhosis in her mother; Diabetes in her father; Hypertension in her father and mother; Kidney failure in her father.    ROS:     Review of Systems  Constitutional: Negative.   HENT: Negative.    Eyes: Negative.   Respiratory: Negative.    Gastrointestinal: Negative.   Genitourinary: Negative.   Musculoskeletal: Negative.   Skin: Negative.   Neurological: Negative.    Endo/Heme/Allergies: Negative.   Psychiatric/Behavioral: Negative.    All other systems reviewed and are negative.     All other systems are reviewed and negative.    PHYSICAL EXAM: VS:  BP (!) 150/84   Pulse 71   Ht 4' 11 (1.499 m)   Wt 113 lb 12.8 oz (51.6 kg)   SpO2 98%   BMI 22.98 kg/m  , BMI Body mass index is 22.98 kg/m. Last weight:  Wt Readings from Last 3 Encounters:  04/20/24 113 lb 12.8 oz (51.6 kg)  04/08/24 109 lb 4.8 oz (49.6 kg)  06/08/20 110 lb (49.9 kg)     Physical Exam Constitutional:      Appearance: Normal appearance.  Cardiovascular:     Rate and Rhythm: Normal rate and regular rhythm.     Heart sounds: Normal heart sounds.  Pulmonary:     Effort: Pulmonary effort is normal.     Breath sounds: Normal breath sounds.  Musculoskeletal:     Right lower leg: No edema.     Left lower leg: No edema.  Neurological:     Mental Status: She is alert.       EKG: NSR 89/MI FREQUENT PVC,OLD ASWMI  Recent Labs: 04/08/2024: ALT 6 04/11/2024: Magnesium  1.6 04/12/2024: BUN 21; Creatinine, Ser 0.83; Hemoglobin 9.3; Platelets 128; Potassium 4.5; Sodium 136 04/16/2024: TSH 1.770    Lipid Panel No results found for: CHOL, TRIG, HDL, CHOLHDL, VLDL, LDLCALC, LDLDIRECT    Other studies Reviewed: Additional studies/ records that were reviewed today include:  Review of the above records demonstrates:       No data to display            ASSESSMENT AND PLAN:    ICD-10-CM   1. Chronic heart failure with preserved ejection fraction (HFpEF) (HCC)  I50.32 MYOCARDIAL PERFUSION IMAGING    torsemide (DEMADEX) 20 MG tablet    simvastatin  (ZOCOR ) 20 MG tablet    dapagliflozin propanediol (FARXIGA) 10 MG TABS tablet    losartan (COZAAR) 25 MG tablet    2. Essential hypertension  I10 MYOCARDIAL PERFUSION IMAGING    torsemide (DEMADEX) 20 MG tablet    simvastatin  (ZOCOR ) 20 MG tablet    dapagliflozin propanediol (FARXIGA) 10 MG TABS  tablet    losartan (COZAAR) 25 MG tablet   Blood pressure high, add losartan 25 mg,, continue toresemide, as BUN/Creat normal now.    3. Mixed hyperlipidemia  E78.2 MYOCARDIAL PERFUSION IMAGING    torsemide (DEMADEX) 20 MG tablet    simvastatin  (ZOCOR ) 20 MG tablet    dapagliflozin propanediol (FARXIGA) 10 MG TABS tablet    losartan (COZAAR) 25 MG tablet    4. New onset a-fib (HCC)  I48.91 MYOCARDIAL PERFUSION IMAGING    torsemide (  DEMADEX ) 20 MG tablet    simvastatin  (ZOCOR ) 20 MG tablet    dapagliflozin  propanediol (FARXIGA ) 10 MG TABS tablet    losartan  (COZAAR ) 25 MG tablet    5. Elevated troponin I level  R79.89 MYOCARDIAL PERFUSION IMAGING    torsemide  (DEMADEX ) 20 MG tablet    simvastatin  (ZOCOR ) 20 MG tablet    dapagliflozin  propanediol (FARXIGA ) 10 MG TABS tablet    losartan  (COZAAR ) 25 MG tablet   Evaluate for CAD, do nuclear stress test.    6. SOB (shortness of breath)  R06.02 MYOCARDIAL PERFUSION IMAGING    torsemide  (DEMADEX ) 20 MG tablet    simvastatin  (ZOCOR ) 20 MG tablet    dapagliflozin  propanediol (FARXIGA ) 10 MG TABS tablet    losartan  (COZAAR ) 25 MG tablet    7. CHF (congestive heart failure), NYHA class III, acute on chronic, diastolic (HCC)  I50.33 MYOCARDIAL PERFUSION IMAGING    torsemide  (DEMADEX ) 20 MG tablet    simvastatin  (ZOCOR ) 20 MG tablet    dapagliflozin  propanediol (FARXIGA ) 10 MG TABS tablet    losartan  (COZAAR ) 25 MG tablet   Normal LVEF 60%, with grade 1diastolic dysfunction, advise adding farxiga  10 mg daily    8. Nonrheumatic mitral valve regurgitation  I34.0 MYOCARDIAL PERFUSION IMAGING    torsemide  (DEMADEX ) 20 MG tablet    simvastatin  (ZOCOR ) 20 MG tablet    dapagliflozin  propanediol (FARXIGA ) 10 MG TABS tablet    losartan  (COZAAR ) 25 MG tablet    9. Nonrheumatic aortic valve insufficiency  I35.1 MYOCARDIAL PERFUSION IMAGING    torsemide  (DEMADEX ) 20 MG tablet    simvastatin  (ZOCOR ) 20 MG tablet    dapagliflozin  propanediol  (FARXIGA ) 10 MG TABS tablet    losartan  (COZAAR ) 25 MG tablet   Moderate AR, mild MR, normal LVEF 60%    10. Abnormal electrocardiogram (ECG) (EKG)  R94.31 MYOCARDIAL PERFUSION IMAGING    torsemide  (DEMADEX ) 20 MG tablet    simvastatin  (ZOCOR ) 20 MG tablet    dapagliflozin  propanediol (FARXIGA ) 10 MG TABS tablet    losartan  (COZAAR ) 25 MG tablet       Problem List Items Addressed This Visit       Cardiovascular and Mediastinum   Essential hypertension   Relevant Medications   torsemide  (DEMADEX ) 20 MG tablet   simvastatin  (ZOCOR ) 20 MG tablet   dapagliflozin  propanediol (FARXIGA ) 10 MG TABS tablet   losartan  (COZAAR ) 25 MG tablet   Other Relevant Orders   MYOCARDIAL PERFUSION IMAGING   Chronic heart failure with preserved ejection fraction (HFpEF) (HCC) - Primary   Relevant Medications   torsemide  (DEMADEX ) 20 MG tablet   simvastatin  (ZOCOR ) 20 MG tablet   dapagliflozin  propanediol (FARXIGA ) 10 MG TABS tablet   losartan  (COZAAR ) 25 MG tablet   Other Relevant Orders   MYOCARDIAL PERFUSION IMAGING     Other   HLD (hyperlipidemia)   Relevant Medications   torsemide  (DEMADEX ) 20 MG tablet   simvastatin  (ZOCOR ) 20 MG tablet   dapagliflozin  propanediol (FARXIGA ) 10 MG TABS tablet   losartan  (COZAAR ) 25 MG tablet   Other Relevant Orders   MYOCARDIAL PERFUSION IMAGING   Other Visit Diagnoses       New onset a-fib (HCC)       Relevant Medications   torsemide  (DEMADEX ) 20 MG tablet   simvastatin  (ZOCOR ) 20 MG tablet   dapagliflozin  propanediol (FARXIGA ) 10 MG TABS tablet   losartan  (COZAAR ) 25 MG tablet   Other Relevant Orders   MYOCARDIAL PERFUSION IMAGING  Elevated troponin I level       Evaluate for CAD, do nuclear stress test.   Relevant Medications   torsemide (DEMADEX) 20 MG tablet   simvastatin  (ZOCOR ) 20 MG tablet   dapagliflozin propanediol (FARXIGA) 10 MG TABS tablet   losartan (COZAAR) 25 MG tablet   Other Relevant Orders   MYOCARDIAL PERFUSION IMAGING      SOB (shortness of breath)       Relevant Medications   torsemide (DEMADEX) 20 MG tablet   simvastatin  (ZOCOR ) 20 MG tablet   dapagliflozin propanediol (FARXIGA) 10 MG TABS tablet   losartan (COZAAR) 25 MG tablet   Other Relevant Orders   MYOCARDIAL PERFUSION IMAGING     CHF (congestive heart failure), NYHA class III, acute on chronic, diastolic (HCC)       Normal LVEF 60%, with grade 1diastolic dysfunction, advise adding farxiga 10 mg daily   Relevant Medications   torsemide (DEMADEX) 20 MG tablet   simvastatin  (ZOCOR ) 20 MG tablet   dapagliflozin propanediol (FARXIGA) 10 MG TABS tablet   losartan (COZAAR) 25 MG tablet   Other Relevant Orders   MYOCARDIAL PERFUSION IMAGING     Nonrheumatic mitral valve regurgitation       Relevant Medications   torsemide (DEMADEX) 20 MG tablet   simvastatin  (ZOCOR ) 20 MG tablet   dapagliflozin propanediol (FARXIGA) 10 MG TABS tablet   losartan (COZAAR) 25 MG tablet   Other Relevant Orders   MYOCARDIAL PERFUSION IMAGING     Nonrheumatic aortic valve insufficiency       Moderate AR, mild MR, normal LVEF 60%   Relevant Medications   torsemide (DEMADEX) 20 MG tablet   simvastatin  (ZOCOR ) 20 MG tablet   dapagliflozin propanediol (FARXIGA) 10 MG TABS tablet   losartan (COZAAR) 25 MG tablet   Other Relevant Orders   MYOCARDIAL PERFUSION IMAGING     Abnormal electrocardiogram (ECG) (EKG)       Relevant Medications   torsemide (DEMADEX) 20 MG tablet   simvastatin  (ZOCOR ) 20 MG tablet   dapagliflozin propanediol (FARXIGA) 10 MG TABS tablet   losartan (COZAAR) 25 MG tablet   Other Relevant Orders   MYOCARDIAL PERFUSION IMAGING          Disposition:   Return in about 4 weeks (around 05/18/2024) for stress test and f/u.    Total time spent: 50 minutes  Signed,  Denyse Bathe, MD  04/20/2024 2:11 PM    [facility]

## 2024-04-20 NOTE — Patient Instructions (Addendum)
 Continue Amiodarone  200 mg daily, Torsemide  10 mg daily and Eliquis  5 mg twice a day. Adding farxiga  10 mg and losartan  25 mg daily.

## 2024-04-21 ENCOUNTER — Encounter: Payer: Self-pay | Admitting: Cardiovascular Disease

## 2024-04-25 ENCOUNTER — Other Ambulatory Visit: Payer: Self-pay

## 2024-04-27 ENCOUNTER — Other Ambulatory Visit: Payer: Self-pay

## 2024-05-01 ENCOUNTER — Other Ambulatory Visit: Payer: Self-pay

## 2024-05-02 ENCOUNTER — Ambulatory Visit: Payer: PRIVATE HEALTH INSURANCE | Admitting: Cardiovascular Disease

## 2024-05-03 ENCOUNTER — Other Ambulatory Visit: Payer: Self-pay

## 2024-05-03 ENCOUNTER — Telehealth: Payer: Self-pay

## 2024-05-03 NOTE — Telephone Encounter (Signed)
 Pt called regarding rx farxiga , stated it was over $300. She cannot afford that and asked if there's a different rx that is cheaper? Please advise

## 2024-05-04 MED ORDER — EMPAGLIFLOZIN 25 MG PO TABS: 25.0000 mg | ORAL_TABLET | Freq: Every day | ORAL | 0 refills | Status: AC

## 2024-05-04 NOTE — Telephone Encounter (Signed)
Pt informed

## 2024-05-05 ENCOUNTER — Telehealth: Payer: Self-pay

## 2024-05-05 ENCOUNTER — Other Ambulatory Visit: Payer: Self-pay

## 2024-05-05 NOTE — Telephone Encounter (Signed)
 Pt's farxiga  was $300 and pt tried jardiance  and it was $900. Pt states she cannot afford either.

## 2024-05-05 NOTE — Telephone Encounter (Signed)
Pt informed

## 2024-05-08 ENCOUNTER — Encounter

## 2024-05-09 ENCOUNTER — Telehealth: Payer: Self-pay

## 2024-05-09 ENCOUNTER — Other Ambulatory Visit: Payer: Self-pay

## 2024-05-09 NOTE — Telephone Encounter (Signed)
 Error

## 2024-05-12 ENCOUNTER — Other Ambulatory Visit: Payer: Self-pay

## 2024-05-12 NOTE — Telephone Encounter (Signed)
 error

## 2024-05-16 ENCOUNTER — Other Ambulatory Visit: Payer: Self-pay

## 2024-05-16 ENCOUNTER — Other Ambulatory Visit (HOSPITAL_COMMUNITY): Payer: Self-pay

## 2024-05-16 ENCOUNTER — Ambulatory Visit: Admitting: Cardiovascular Disease

## 2024-05-17 ENCOUNTER — Other Ambulatory Visit: Payer: Self-pay

## 2024-05-17 MED ORDER — AMIODARONE HCL 200 MG PO TABS
200.0000 mg | ORAL_TABLET | Freq: Every day | ORAL | 0 refills | Status: AC
  Filled 2024-05-17: qty 30, 30d supply, fill #0

## 2024-05-19 ENCOUNTER — Other Ambulatory Visit: Payer: Self-pay

## 2024-05-22 ENCOUNTER — Encounter

## 2024-05-25 ENCOUNTER — Ambulatory Visit: Admitting: Cardiovascular Disease

## 2024-05-29 ENCOUNTER — Encounter

## 2024-06-01 ENCOUNTER — Ambulatory Visit: Admitting: Cardiovascular Disease
